# Patient Record
Sex: Male | Born: 1995 | Race: Black or African American | Hispanic: No | Marital: Single | Smoking: Current some day smoker
Health system: Southern US, Community
[De-identification: ages and names within clinical notes are randomized; demographics above are authoritative.]

## PROBLEM LIST (undated history)

## (undated) ENCOUNTER — Ambulatory Visit (HOSPITAL_COMMUNITY): Admission: EM | Payer: Self-pay | Source: Home / Self Care

## (undated) ENCOUNTER — Emergency Department (HOSPITAL_COMMUNITY): Admission: EM | Payer: Self-pay | Source: Home / Self Care

## (undated) DIAGNOSIS — I10 Essential (primary) hypertension: Secondary | ICD-10-CM

## (undated) DIAGNOSIS — K219 Gastro-esophageal reflux disease without esophagitis: Secondary | ICD-10-CM

---
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
## 2015-11-01 ENCOUNTER — Encounter (HOSPITAL_COMMUNITY): Payer: Self-pay | Admitting: Emergency Medicine

## 2015-11-01 ENCOUNTER — Emergency Department (HOSPITAL_COMMUNITY)
Admission: EM | Admit: 2015-11-01 | Discharge: 2015-11-01 | Disposition: A | Discharge status: Home / Self Care | Payer: Self-pay | Attending: Emergency Medicine | Admitting: Emergency Medicine

## 2015-11-01 ENCOUNTER — Emergency Department (HOSPITAL_COMMUNITY): Payer: Self-pay

## 2015-11-01 DIAGNOSIS — J029 Acute pharyngitis, unspecified: Secondary | ICD-10-CM | POA: Insufficient documentation

## 2015-11-01 LAB — RAPID STREP SCREEN (MED CTR MEBANE ONLY): Streptococcus, Group A Screen (Direct): NEGATIVE

## 2015-11-01 MED ORDER — PENICILLIN G BENZATHINE 1200000 UNIT/2ML IM SUSP
1.2000 10*6.[IU] | Freq: Once | INTRAMUSCULAR | Status: AC
  Administered 2015-11-01: 1.2 10*6.[IU] via INTRAMUSCULAR
  Filled 2015-11-01: qty 2

## 2015-11-01 MED ORDER — MAGIC MOUTHWASH
5.0000 mL | Freq: Once | ORAL | Status: AC
  Administered 2015-11-01: 5 mL via ORAL
  Filled 2015-11-01: qty 5

## 2015-11-01 MED ORDER — IBUPROFEN 600 MG PO TABS: 600.0000 mg | ORAL_TABLET | Freq: Four times a day (QID) | ORAL | Status: DC | PRN

## 2015-11-01 MED ORDER — IBUPROFEN 200 MG PO TABS
600.0000 mg | ORAL_TABLET | Freq: Once | ORAL | Status: AC
  Administered 2015-11-01: 600 mg via ORAL
  Filled 2015-11-01: qty 3

## 2015-11-01 MED ORDER — PENICILLIN G BENZATHINE & PROC 1200000 UNIT/2ML IM SUSP
1.2000 10*6.[IU] | Freq: Once | INTRAMUSCULAR | Status: DC
Start: 2015-11-01 — End: 2015-11-01
  Filled 2015-11-01: qty 2

## 2015-11-01 MED ORDER — DEXAMETHASONE SODIUM PHOSPHATE 10 MG/ML IJ SOLN
10.0000 mg | Freq: Once | INTRAMUSCULAR | Status: AC
  Administered 2015-11-01: 10 mg via INTRAMUSCULAR
  Filled 2015-11-01: qty 1

## 2015-11-01 NOTE — Discharge Instructions (Signed)
We saw you in the ER for sore throat. The ER workup currently doesn't show any severe complication from your infection. Take the medicines prescribed.  Return to the ER immediately If your symptoms worsen - that is, you have difficulty breathing, drooling, inability to open mouth, severe headaches, neck pain.   Pharyngitis Pharyngitis is redness, pain, and swelling (inflammation) of your pharynx.  CAUSES  Pharyngitis is usually caused by infection. Most of the time, these infections are from viruses (viral) and are part of a cold. However, sometimes pharyngitis is caused by bacteria (bacterial). Pharyngitis can also be caused by allergies. Viral pharyngitis may be spread from person to person by coughing, sneezing, and personal items or utensils (cups, forks, spoons, toothbrushes). Bacterial pharyngitis may be spread from person to person by more intimate contact, such as kissing.  SIGNS AND SYMPTOMS  Symptoms of pharyngitis include:   Sore throat.   Tiredness (fatigue).   Low-grade fever.   Headache.  Joint pain and muscle aches.  Skin rashes.  Swollen lymph nodes.  Plaque-like film on throat or tonsils (often seen with bacterial pharyngitis). DIAGNOSIS  Your health care provider will ask you questions about your illness and your symptoms. Your medical history, along with a physical exam, is often all that is needed to diagnose pharyngitis. Sometimes, a rapid strep test is done. Other lab tests may also be done, depending on the suspected cause.  TREATMENT  Viral pharyngitis will usually get better in 3-4 days without the use of medicine. Bacterial pharyngitis is treated with medicines that kill germs (antibiotics).  HOME CARE INSTRUCTIONS   Drink enough water and fluids to keep your urine clear or pale yellow.   Only take over-the-counter or prescription medicines as directed by your health care provider:   If you are prescribed antibiotics, make sure you finish them  even if you start to feel better.   Do not take aspirin.   Get lots of rest.   Gargle with 8 oz of salt water ( tsp of salt per 1 qt of water) as often as every 1-2 hours to soothe your throat.   Throat lozenges (if you are not at risk for choking) or sprays may be used to soothe your throat. SEEK MEDICAL CARE IF:   You have large, tender lumps in your neck.  You have a rash.  You cough up green, yellow-brown, or bloody spit. SEEK IMMEDIATE MEDICAL CARE IF:   Your neck becomes stiff.  You drool or are unable to swallow liquids.  You vomit or are unable to keep medicines or liquids down.  You have severe pain that does not go away with the use of recommended medicines.  You have trouble breathing (not caused by a stuffy nose). MAKE SURE YOU:   Understand these instructions.  Will watch your condition.  Will get help right away if you are not doing well or get worse.   This information is not intended to replace advice given to you by your health care provider. Make sure you discuss any questions you have with your health care provider.   Document Released: 04/23/2005 Document Revised: 02/11/2013 Document Reviewed: 12/29/2012 [employer] Interactive Patient Education [employer]2016 [employer]

## 2015-11-01 NOTE — ED Provider Notes (Signed)
CSN: [id number]     Arrival date & time 11/01/15  [id number] History   First MD Initiated Contact with Patient 11/01/15 [phone]     Chief Complaint  Patient presents with  . Sore Throat     (Consider location/radiation/quality/duration/timing/severity/associated sxs/prior Treatment) HPI Comments: Pt comes in with cc of sore throat. Sore throat began on Friday and got worse over the weekend. Pt reports subjective fevers, hoarseness in voice, pain with swallowing and sometimes trouble swallowing. No drooling, no respiratory distress. Pt feels like his mid part of his throat is the worst. No sick contacts. No cough.   Patient is a 20 y.o. male presenting with pharyngitis. The history is provided by the patient.  Sore Throat Pertinent negatives include no shortness of breath.    Past Medical History  Diagnosis Date  . Hypertension   . Acid reflux    History reviewed. No pertinent past surgical history. No family history on file. Social History  Substance Use Topics  . Smoking status: Never Smoker   . Smokeless tobacco: None  . Alcohol Use: No    Review of Systems  Constitutional: Negative for activity change.  HENT: Positive for sore throat. Negative for drooling.   Eyes: Negative for visual disturbance.  Respiratory: Negative for cough and shortness of breath.   Allergic/Immunologic: Negative for immunocompromised state.      Allergies  Review of patient's allergies indicates no known allergies.  Home Medications   Prior to Admission medications   Medication Sig Start Date End Date Taking? Authorizing Provider  ibuprofen (ADVIL,MOTRIN) 600 MG tablet Take 1 tablet (600 mg total) by mouth every 6 (six) hours as needed. 11/01/15   Derwood KaplanAnkit Haset [place], MD  trimethoprim-polymyxin b (POLYTRIM) ophthalmic solution Place 1 drop into the right eye every 4 (four) hours. 10/16/13   Kaitlyn Szekalski, PA-C   BP 139/86 mmHg  Pulse 71  Temp(Src) 98.7 F (37.1 C) (Oral)  Resp 18  SpO2  98% Physical Exam  Constitutional: He is oriented to person, place, and time. He appears well-developed.  HENT:  Head: Atraumatic.  Mouth/Throat: Oropharyngeal exudate present.  No trismus  Neck: Normal range of motion. Neck supple. No tracheal deviation present.  Cardiovascular: Normal rate.   Pulmonary/Chest: Effort normal. No stridor.  Musculoskeletal: He exhibits no edema.  Lymphadenopathy:    He has cervical adenopathy.  Neurological: He is alert and oriented to person, place, and time.  Skin: Skin is warm.  Nursing note and vitals reviewed.   ED Course  Procedures (including critical care time) Labs Review Labs Reviewed  RAPID STREP SCREEN (NOT AT [facility])  CULTURE, GROUP A STREP [facility])    Imaging Review Dg Neck Soft Tissue  11/01/2015  CLINICAL DATA:  Worsening sore throat with neck pain. EXAM: NECK SOFT TISSUES - 1+ VIEW COMPARISON:  None. FINDINGS: Diffuse enlargement of tonsils including adenoid, palatine, and lingual. No airway narrowing/ deviation or prevertebral thickening. Normal epiglottis. No retropharyngeal calcification or osseous finding. IMPRESSION: Diffuse tonsillar enlargement. Electronically Signed   By: Marnee SpringJonathon  Watts M.D.   On: 11/01/2015 10:18   I have personally reviewed and evaluated these images and lab results as part of my medical decision-making.   EKG Interpretation None      MDM   Final diagnoses:  Acute pharyngitis, unspecified pharyngitis type    Pt has sore throat with fair amount of tonsillar enlargement and even exudates. He has no drooling, no resp distress, no stridor. Still Xray soft tissue neck done since he was  c/o lower throat issues  -and the Xrays are reassuring. Will give pen IM and decadron.  Strict ER return precautions have been discussed, and patient is agreeing with the plan and is comfortable with the workup done and the recommendations from the ER.     Derwood KaplanAnkit Grisela Mesch, MD 11/01/15 1052

## 2015-11-01 NOTE — ED Notes (Signed)
Per pt, states sore throat since last week

## 2015-11-01 NOTE — ED Notes (Signed)
Bed: WA05 Expected date:  Expected time:  Means of arrival:  Comments: EVS 

## 2015-11-04 LAB — CULTURE, GROUP A STREP (THRC)

## 2015-11-11 ENCOUNTER — Encounter (HOSPITAL_COMMUNITY): Payer: Self-pay | Admitting: Emergency Medicine

## 2015-11-11 ENCOUNTER — Emergency Department (HOSPITAL_COMMUNITY): Payer: Self-pay

## 2015-11-11 ENCOUNTER — Emergency Department (HOSPITAL_COMMUNITY)
Admission: EM | Admit: 2015-11-11 | Discharge: 2015-11-11 | Disposition: A | Discharge status: Home / Self Care | Payer: Self-pay | Attending: Emergency Medicine | Admitting: Emergency Medicine

## 2015-11-11 DIAGNOSIS — R110 Nausea: Secondary | ICD-10-CM | POA: Insufficient documentation

## 2015-11-11 DIAGNOSIS — I10 Essential (primary) hypertension: Secondary | ICD-10-CM | POA: Insufficient documentation

## 2015-11-11 DIAGNOSIS — Z791 Long term (current) use of non-steroidal anti-inflammatories (NSAID): Secondary | ICD-10-CM | POA: Insufficient documentation

## 2015-11-11 DIAGNOSIS — J029 Acute pharyngitis, unspecified: Secondary | ICD-10-CM | POA: Insufficient documentation

## 2015-11-11 DIAGNOSIS — R590 Localized enlarged lymph nodes: Secondary | ICD-10-CM | POA: Insufficient documentation

## 2015-11-11 LAB — CBC WITH DIFFERENTIAL/PLATELET
Basophils Absolute: 0 10*3/uL (ref 0.0–0.1)
Basophils Relative: 0 %
EOS ABS: 0.2 10*3/uL (ref 0.0–0.7)
EOS PCT: 1 %
HCT: 46.2 % (ref 39.0–52.0)
HEMOGLOBIN: 16.4 g/dL (ref 13.0–17.0)
LYMPHS ABS: 2.6 10*3/uL (ref 0.7–4.0)
Lymphocytes Relative: 14 %
MCH: 28.2 pg (ref 26.0–34.0)
MCHC: 35.5 g/dL (ref 30.0–36.0)
MCV: 79.5 fL (ref 78.0–100.0)
MONO ABS: 0.9 10*3/uL (ref 0.1–1.0)
Monocytes Relative: 5 %
NEUTROS PCT: 80 %
Neutro Abs: 14.6 10*3/uL — ABNORMAL HIGH (ref 1.7–7.7)
PLATELETS: 257 10*3/uL (ref 150–400)
RBC: 5.81 MIL/uL (ref 4.22–5.81)
RDW: 13 % (ref 11.5–15.5)
WBC: 18.3 10*3/uL — AB (ref 4.0–10.5)

## 2015-11-11 LAB — BASIC METABOLIC PANEL
Anion gap: 8 (ref 5–15)
BUN: 12 mg/dL (ref 6–20)
CALCIUM: 9.6 mg/dL (ref 8.9–10.3)
CO2: 24 mmol/L (ref 22–32)
CREATININE: 0.98 mg/dL (ref 0.61–1.24)
Chloride: 104 mmol/L (ref 101–111)
Glucose, Bld: 105 mg/dL — ABNORMAL HIGH (ref 65–99)
Potassium: 4 mmol/L (ref 3.5–5.1)
SODIUM: 136 mmol/L (ref 135–145)

## 2015-11-11 LAB — RAPID STREP SCREEN (MED CTR MEBANE ONLY): STREPTOCOCCUS, GROUP A SCREEN (DIRECT): NEGATIVE

## 2015-11-11 LAB — MONONUCLEOSIS SCREEN: MONO SCREEN: NEGATIVE

## 2015-11-11 MED ORDER — IOPAMIDOL (ISOVUE-300) INJECTION 61%
75.0000 mL | Freq: Once | INTRAVENOUS | Status: AC | PRN
  Administered 2015-11-11: 75 mL via INTRAVENOUS

## 2015-11-11 MED ORDER — DEXAMETHASONE SODIUM PHOSPHATE 10 MG/ML IJ SOLN
10.0000 mg | Freq: Once | INTRAMUSCULAR | Status: AC
  Administered 2015-11-11: 10 mg via INTRAVENOUS
  Filled 2015-11-11: qty 1

## 2015-11-11 MED ORDER — CLINDAMYCIN PHOSPHATE 600 MG/50ML IV SOLN
600.0000 mg | Freq: Once | INTRAVENOUS | Status: AC
  Administered 2015-11-11: 600 mg via INTRAVENOUS
  Filled 2015-11-11: qty 50

## 2015-11-11 MED ORDER — SODIUM CHLORIDE 0.9 % IV BOLUS (SEPSIS)
1000.0000 mL | Freq: Once | INTRAVENOUS | Status: AC
  Administered 2015-11-11: 1000 mL via INTRAVENOUS

## 2015-11-11 MED ORDER — NAPROXEN 500 MG PO TABS: 500.0000 mg | ORAL_TABLET | Freq: Two times a day (BID) | ORAL | Status: DC | PRN

## 2015-11-11 MED ORDER — CLINDAMYCIN HCL 150 MG PO CAPS: 300.0000 mg | ORAL_CAPSULE | Freq: Four times a day (QID) | ORAL | Status: DC

## 2015-11-11 NOTE — ED Notes (Signed)
Pt complaint of continued sore throat post treatment 6/27.

## 2015-11-11 NOTE — ED Notes (Signed)
Pt sipping water with no difficulty

## 2015-11-11 NOTE — Discharge Instructions (Signed)
Please take all of your antibiotics until finished! Continue to stay well-hydrated. Gargle warm salt water and spit it out to ease sore throat. Naproxen as needed for pain. You can also alternate this with tylenol as needed for pain. Follow up with your primary care doctor in 5-7 days for recheck of ongoing symptoms and return to emergency department for difficulty breathing, difficulty swallowing, any new or worsening of symptoms.

## 2015-11-11 NOTE — ED Notes (Signed)
PA at bedside.

## 2015-11-11 NOTE — ED Provider Notes (Signed)
CSN: [id number]     Arrival date & time 11/11/15  0919 History   First MD Initiated Contact with Patient 11/11/15 [phone]     Chief Complaint  Patient presents with  . Sore Throat    (Consider location/radiation/quality/duration/timing/severity/associated sxs/prior Treatment) Patient is a 20 y.o. male presenting with pharyngitis. The history is provided by the patient and medical records. No language interpreter was used.  Sore Throat Associated symptoms include nausea and a sore throat. Pertinent negatives include no abdominal pain, chills, congestion, coughing, fever, headaches, myalgias, rash or vomiting.     Harry Donovan is a 20 y.o. male  with a PMH of HTN, GERD who presents to the Emergency Department complaining of worsening sore throat over the last two weeks. Seen in ED on 6/27 where rapid strep and throat cx were negative. DG neck at that time showed diffuse tonsillar enlargement. He was treated with pen IM and decadron at that time. Patient states sore throat has continued to worsen and he is having more difficulty swallowing than in the past, often having to spit out excess secretions. Denies difficulty breathing currently, but states at night when he is laying down sleeping, he will often awaken gasping for air. Admits to mild associated nausea and decreased PO. Denies fever, abdominal pain, chest pain.   Past Medical History  Diagnosis Date  . Hypertension   . Acid reflux    History reviewed. No pertinent past surgical history. No family history on file. Social History  Substance Use Topics  . Smoking status: Never Smoker   . Smokeless tobacco: None  . Alcohol Use: No    Review of Systems  Constitutional: Positive for appetite change. Negative for fever and chills.  HENT: Positive for sore throat and trouble swallowing. Negative for congestion.   Eyes: Negative for visual disturbance.  Respiratory: Negative for cough.   Cardiovascular: Negative.   Gastrointestinal:  Positive for nausea. Negative for vomiting and abdominal pain.  Musculoskeletal: Negative for myalgias and neck stiffness.  Skin: Negative for rash.  Allergic/Immunologic: Negative for immunocompromised state.  Neurological: Negative for headaches.      Allergies  Review of patient's allergies indicates no known allergies.  Home Medications   Prior to Admission medications   Medication Sig Start Date End Date Taking? Authorizing Provider  ibuprofen (ADVIL,MOTRIN) 600 MG tablet Take 1 tablet (600 mg total) by mouth every 6 (six) hours as needed. Patient taking differently: Take 600 mg by mouth every 6 (six) hours as needed for moderate pain.  11/01/15  Yes Derwood Kaplan, MD  clindamycin (CLEOCIN) 150 MG capsule Take 2 capsules (300 mg total) by mouth 4 (four) times daily. 11/11/15   Chase Picket Zilda No, PA-C  naproxen (NAPROSYN) 500 MG tablet Take 1 tablet (500 mg total) by mouth 2 (two) times daily as needed for mild pain or moderate pain. 11/11/15   Neveyah Garzon Pilcher Basir Niven, PA-C   BP 140/74 mmHg  Pulse 70  Temp(Src) 98.6 F (37 C) (Oral)  Resp 16  Ht  (1.803 m)  Wt 131.543 kg  BMI 40.46 kg/m2  SpO2 98% Physical Exam  Constitutional: He is oriented to person, place, and time. He appears well-developed and well-nourished. No distress.  Hoarse sounding voice, NAD.   HENT:  Head: Normocephalic and atraumatic.  OP with exudates and significant tonsillar hypertrophy. Uvula midline. No focal sinus tenderness.   Neck: Normal range of motion. Neck supple. No tracheal deviation present.  No meningeal signs. Mild TTP of right anterior  neck.  Cardiovascular: Normal rate, regular rhythm and normal heart sounds.   Pulmonary/Chest: Effort normal. No stridor.  Lungs are clear to auscultation bilaterally - no w/r/r. 98-100% O2 on RA.   Abdominal: Soft. He exhibits no distension. There is no tenderness.  Musculoskeletal: Normal range of motion.  Lymphadenopathy:    He has cervical adenopathy.   Neurological: He is alert and oriented to person, place, and time.  Skin: Skin is warm and dry. He is not diaphoretic.  Nursing note and vitals reviewed.   ED Course  Procedures (including critical care time) Labs Review Labs Reviewed  BASIC METABOLIC PANEL - Abnormal; Notable for the following:    Glucose, Bld 105 (*)    All other components within normal limits  CBC WITH DIFFERENTIAL/PLATELET - Abnormal; Notable for the following:    WBC 18.3 (*)    Neutro Abs 14.6 (*)    All other components within normal limits  RAPID STREP SCREEN (NOT AT [facility])  CULTURE, GROUP A STREP [facility])  MONONUCLEOSIS SCREEN    Imaging Review Ct Soft Tissue Neck W Contrast  11/11/2015  CLINICAL DATA:  20 year old with sore throat for 4 weeks. Worsening despite antibiotic and steroid therapy. EXAM: CT NECK WITH CONTRAST TECHNIQUE: Multidetector CT imaging of the neck was performed using the standard protocol following the bolus administration of intravenous contrast. CONTRAST:  75mL ISOVUE-300 IOPAMIDOL (ISOVUE-300) INJECTION 61% COMPARISON:  Soft tissue neck radiographs 11/01/2015 FINDINGS: Pharynx and larynx: There is diffuse enlargement of the palatine and adenoid tonsils with associated fullness of the nasopharynx. There is no thickening of the epiglottis. The larynx is normal. The parapharyngeal spaces are normal. Salivary glands: Parotid glands and submandibular glands are normal. No sialolithiasis. Thyroid: Normal Lymph nodes: There are numerous bilateral enlarged cervical lymph nodes, with the largest located at left Level 3 (series 3, image 58) measuring 1.6 x 1.4 cm. Multiple enlarged level IIa nodes, measuring up to 1.6 cm. Vascular: Normal Limited intracranial: Normal Visualized orbits: Unremarkable Mastoids and visualized paranasal sinuses: Normal Skeleton: Normal Upper chest: Normal IMPRESSION: 1. Diffuse enlargement of the palatine and adenoid tonsils, consistent with tonsillitis. No tonsillar abscess  or fluid collection. 2. Bilateral, left-greater-than-right, reactive cervical lymphadenopathy. Electronically Signed   By: Deatra RobinsonKevin  Herman M.D.   On: 11/11/2015 11:20   I have personally reviewed and evaluated these images and lab results as part of my medical decision-making.   EKG Interpretation None      MDM   Final diagnoses:  Pharyngitis   Harry Donovan presents to ED for worsening sore throat x 2 weeks. Seen in ED for same on 6/27 - chart reviewed from that visit. Cx from that visit negative for strep. Pen IM given that day as well. On exam, patient is 98-100% O2 on RA with no increased effort in breathing. OP with significant tonsillar hypertrophy. TTP of right anterior neck. Appears to be having some difficulty swallowing secretions, stating mainly 2/2 pain. Will obtain CT soft tissue neck to further assess, as well as checking mono, cbc, and bmp. Poor PO intake -- will give IV fluids. Clinda IV given as well.   CBC with white count of 18.3 and shift of 14.6. Strep and mono negative, BMP reassuring. CT neck shows no abscess or fluid collection. Diffuse enlargement consistent with tonsillitis and reactive cervical lymphadenopathy seen. Patient was reevaluated and does feel much improved after Decadron. Tolerating PO and drinking water without difficulty. No airway compromise. Feel the patient is safe for discharge to  home, however strict return precautions were given and PCP follow-up recommended in 5-7 days for recheck of symptoms. Rx for Clinda and naproxen. Symptomatic home care instructions were discussed. Patient and mother at bedside agree with treatment plan and understand reasons to return to the ED immediately.  Patient discussed with Dr. Bebe ShaggyWickline who agrees with treatment plan.     [facility] ServicesJaime Pilcher Arly Salminen, PA-C 11/11/15 1242  Zadie Rhineonald Wickline, MD 11/12/15 [phone]

## 2015-11-13 LAB — CULTURE, GROUP A STREP (THRC)

## 2017-03-16 ENCOUNTER — Ambulatory Visit: Payer: Self-pay | Admitting: Family Medicine

## 2017-06-26 ENCOUNTER — Encounter (HOSPITAL_COMMUNITY): Payer: Self-pay

## 2017-06-26 ENCOUNTER — Emergency Department (HOSPITAL_COMMUNITY)
Admission: EM | Admit: 2017-06-26 | Discharge: 2017-06-26 | Disposition: A | Discharge status: Home / Self Care | Payer: Self-pay | Attending: Emergency Medicine | Admitting: Emergency Medicine

## 2017-06-26 DIAGNOSIS — Y929 Unspecified place or not applicable: Secondary | ICD-10-CM | POA: Insufficient documentation

## 2017-06-26 DIAGNOSIS — Z79899 Other long term (current) drug therapy: Secondary | ICD-10-CM | POA: Insufficient documentation

## 2017-06-26 DIAGNOSIS — Z189 Retained foreign body fragments, unspecified material: Secondary | ICD-10-CM | POA: Insufficient documentation

## 2017-06-26 DIAGNOSIS — Y999 Unspecified external cause status: Secondary | ICD-10-CM | POA: Insufficient documentation

## 2017-06-26 DIAGNOSIS — S00452A Superficial foreign body of left ear, initial encounter: Secondary | ICD-10-CM | POA: Insufficient documentation

## 2017-06-26 DIAGNOSIS — X58XXXA Exposure to other specified factors, initial encounter: Secondary | ICD-10-CM | POA: Insufficient documentation

## 2017-06-26 DIAGNOSIS — H60393 Other infective otitis externa, bilateral: Secondary | ICD-10-CM

## 2017-06-26 DIAGNOSIS — Y939 Activity, unspecified: Secondary | ICD-10-CM | POA: Insufficient documentation

## 2017-06-26 DIAGNOSIS — I10 Essential (primary) hypertension: Secondary | ICD-10-CM | POA: Insufficient documentation

## 2017-06-26 DIAGNOSIS — S00451A Superficial foreign body of right ear, initial encounter: Secondary | ICD-10-CM | POA: Insufficient documentation

## 2017-06-26 MED ORDER — SULFAMETHOXAZOLE-TRIMETHOPRIM 800-160 MG PO TABS: 1.0000 | ORAL_TABLET | Freq: Two times a day (BID) | ORAL | 0 refills | Status: DC

## 2017-06-26 NOTE — ED Provider Notes (Signed)
[facility] Provider Note   CSN: [id number] Arrival date & time: 06/26/17  1137     History   Chief Complaint No chief complaint on file.   HPI Harry Donovan is a 22 y.o. male with a PMHx of HTN, GERD, and other conditions listed below, who presents to the ED with complaints of both earrings being stuck in his earlobes since last night.  Patient reports that he put his earrings and they got stuck, with the post that goes through his earlobe becoming embedded within the earlobe itself.  He reports 4/10 intermittent throbbing nonradiating left earlobe pain which worsens with touching the area and with no treatments tried prior to arrival.  He also mentions that he has had some drainage from both earlobes for a few days, although slightly worse on the left.  He denies any warmth to the earlobes.  He denies any ear pain aside from the earlobes, or ear drainage aside from the earlobes.  He denies any hearing loss, fevers, or any other complaints at this time.  He denies history of any immunocompromising conditions such as diabetes or HIV.   The history is provided by the patient and medical records. No language interpreter was used.  Foreign Body in Ear  This is a new problem. The current episode started yesterday. The problem occurs constantly. The problem has not changed since onset.Nothing aggravates the symptoms. Nothing relieves the symptoms. He has tried nothing for the symptoms. The treatment provided no relief.    Past Medical History:  Diagnosis Date  . Acid reflux   . Hypertension     Patient Active Problem List   Diagnosis Date Noted  . Hyperglycemia 01/10/2011  . Chest pain 12/06/2010  . Essential hypertension, benign 12/06/2010  . Encounter for sports participation examination 12/06/2010    History reviewed. No pertinent surgical history.     Home Medications    Prior to Admission medications   Medication Sig Start Date End  Date Taking? Authorizing Provider  clindamycin (CLEOCIN) 150 MG capsule Take 2 capsules (300 mg total) by mouth 4 (four) times daily. 11/11/15   Ward, Chase PicketJaime Pilcher, PA-C  ibuprofen (ADVIL,MOTRIN) 600 MG tablet Take 1 tablet (600 mg total) by mouth every 6 (six) hours as needed. Patient taking differently: Take 600 mg by mouth every 6 (six) hours as needed for moderate pain.  11/01/15   Derwood KaplanNanavati, Ankit, MD  naproxen (NAPROSYN) 500 MG tablet Take 1 tablet (500 mg total) by mouth 2 (two) times daily as needed for mild pain or moderate pain. 11/11/15   Ward, Chase PicketJaime Pilcher, PA-C    Family History No family history on file.  Social History Social History   Tobacco Use  . Smoking status: Never Smoker  Substance Use Topics  . Alcohol use: No  . Drug use: Not on file     Allergies   Patient has no known allergies.   Review of Systems Review of Systems  Constitutional: Negative for chills and fever.  HENT: Positive for ear discharge (earlobe, but not within ear) and ear pain (left earlobe, not within ear). Negative for hearing loss.   Skin: Positive for wound (earlobes).  Allergic/Immunologic: Negative for immunocompromised state.     Physical Exam Updated Vital Signs BP (!) 158/77 (BP Location: Right Arm)   Pulse (!) 50   Temp 98.5 F (36.9 C) (Oral)   Resp 16   SpO2 99%   Physical Exam  Constitutional: He is oriented  to person, place, and time. Vital signs are normal. He appears well-developed and well-nourished.  Non-toxic appearance. No distress.  Afebrile, nontoxic, NAD  HENT:  Head: Normocephalic and atraumatic.  Right Ear: There is swelling (earlobe) and tenderness (earlobe). No drainage. A foreign body (earring embedded in earlobe ) is present.  Left Ear: There is drainage (earlobe), swelling (earlobe) and tenderness (earlobe). A foreign body (earring embedded in earlobe) is present.  Mouth/Throat: Mucous membranes are normal.  Hoop earring posts embedded into earlobe on  both sides of the lobe, easily removed with forceps; mild swelling and scantly purulent drainage from L earlobe, no erythema or warmth; minimal swelling and slight crusting of serous fluid on R earlobe, also without erythema or warmth. Left earlobe mildly tender and indurated, no fluctuance. R earlobe without tenderness.  Eyes: Conjunctivae and EOM are normal. Right eye exhibits no discharge. Left eye exhibits no discharge.  Neck: Normal range of motion. Neck supple.  Cardiovascular: Normal rate and intact distal pulses.  Pulmonary/Chest: Effort normal. No respiratory distress.  Abdominal: Normal appearance. He exhibits no distension.  Musculoskeletal: Normal range of motion.  Neurological: He is alert and oriented to person, place, and time. He has normal strength. No sensory deficit.  Skin: Skin is warm, dry and intact. No rash noted.  See HENT exam  Psychiatric: He has a normal mood and affect.  Nursing note and vitals reviewed.    ED Treatments / Results  Labs (all labs ordered are listed, but only abnormal results are displayed) Labs Reviewed - No data to display  EKG  EKG Interpretation None       Radiology No results found.  Procedures .Foreign Body Removal Date/Time: 06/26/2017 12:32 PM Performed by: Rhona Raider, PA-C Authorized by: Rhona Raider, [place]  Consent: Verbal consent obtained. Risks and benefits: risks, benefits and alternatives were discussed Consent given by: patient Patient understanding: patient states understanding of the procedure being performed Patient consent: the patient's understanding of the procedure matches consent given Patient identity confirmed: verbally with patient Body area: skin General location: head/neck Location details: right external ear  Sedation: Patient sedated: no  Patient restrained: no Patient cooperative: yes Localization method: visualized Removal mechanism: hemostat Tendon involvement: none Depth:  subcutaneous Complexity: simple 1 objects recovered. Objects recovered: earring Post-procedure assessment: foreign body removed Patient tolerance: Patient tolerated the procedure well with no immediate complications Comments: Earring embedded into ear lobe, removed using hemostats .Foreign Body Removal Date/Time: 06/26/2017 12:43 PM Performed by: Rhona Raider, PA-C Authorized by: Rhona Raider, [place]  Consent: Verbal consent obtained. Risks and benefits: risks, benefits and alternatives were discussed Consent given by: patient Patient understanding: patient states understanding of the procedure being performed Patient consent: the patient's understanding of the procedure matches consent given Patient identity confirmed: verbally with patient Body area: skin General location: head/neck Location details: left external ear  Sedation: Patient sedated: no  Patient restrained: no Patient cooperative: yes Localization method: visualized Removal mechanism: hemostat Tendon involvement: none Depth: subcutaneous Complexity: simple 1 objects recovered. Objects recovered: earring Post-procedure assessment: foreign body removed Patient tolerance: Patient tolerated the procedure well with no immediate complications Comments: Earring embedded into ear lobe, removed using hemostats   (including critical care time)  Medications Ordered in ED Medications - No data to display   Initial Impression / Assessment and Plan / ED Course  I have reviewed the triage vital signs and the nursing notes.  Pertinent labs & imaging results that were available during my care of the patient  were reviewed by me and considered in my medical decision making (see chart for details).     22 y.o. male here with embedded earrings in both earlobes. Has had some pain mostly to L ear, and reports having some drainage from both earlobes for a few days, L>R. On exam, earring posts embedded into earlobe on both  sides of the lobe, easily removed with forceps; mild swelling and scantly purulent drainage from L earlobe, no erythema or warmth; minimal swelling and slight crusting of serous fluid on R earlobe, also without erythema or warmth. Left earlobe mildly tender and indurated, no fluctuance. Could be early superficial infection, does not appear to be deep or very involved, not quite cellulitic. Will attempt topical wound care and if improving then no need for abx; if unimproved in 2-3 days, or if symptoms start worsening, pt given safety script for PO abx to start in that case. Strict guidelines of this discussed. Advised not using earrings until areas improve. F/up with PCP in 5-7 days for recheck. I explained the diagnosis and have given explicit precautions to return to the ER including for any other new or worsening symptoms. The patient understands and accepts the medical plan as it's been dictated and I have answered their questions. Discharge instructions concerning home care and prescriptions have been given. The patient is STABLE and is discharged to home in good condition.    Final Clinical Impressions(s) / ED Diagnoses   Final diagnoses:  Embedded earring of left ear, initial encounter  Embedded earring of right ear, initial encounter  Infection of both earlobes    ED Discharge Orders        Ordered    sulfamethoxazole-trimethoprim (BACTRIM DS,SEPTRA DS) 800-160 MG tablet  2 times daily     06/26/17 [address], [place], [place] 06/26/17 1253    Cathren Laine, MD 06/26/17 2146

## 2017-06-26 NOTE — Discharge Instructions (Signed)
Keep wound clean and dry, using hydrogen peroxide and a topical antibiotic ointment AT LEAST 4 TIMES PER DAY. Apply warm compresses to affected area throughout the day. If the infections worsen, or fail to improve in the next 2-3 days, then start taking the antibiotic as directed until it is finished. IF THE INFECTIONS IMPROVE WITH JUST THE TOPICAL ANTIBIOTIC OINTMENT, THEN YOU DO NOT NEED TO START THE ORAL ANTIBIOTIC. Alternate between tylenol and motrin as needed for pain. DO NOT PUT ANY EARRINGS IN UNTIL YOUR EARLOBE INFECTIONS IMPROVE. [facility]-7 days for wound recheck and recheck of symptoms. Monitor area for signs of infection to include, but not limited to: increasing pain, spreading redness, drainage/pus, worsening swelling, or fevers. Return to emergency department for emergent changing or worsening symptoms.

## 2017-06-26 NOTE — ED Triage Notes (Signed)
Patient complains of bilateral ear soreness due to earrings being stuck in ears since last night. NAD

## 2017-07-18 ENCOUNTER — Emergency Department (HOSPITAL_COMMUNITY)
Admission: EM | Admit: 2017-07-18 | Discharge: 2017-07-18 | Disposition: A | Discharge status: Home / Self Care | Payer: Self-pay | Attending: Emergency Medicine | Admitting: Emergency Medicine

## 2017-07-18 ENCOUNTER — Emergency Department (HOSPITAL_COMMUNITY): Payer: Self-pay

## 2017-07-18 ENCOUNTER — Encounter (HOSPITAL_COMMUNITY): Payer: Self-pay | Admitting: Emergency Medicine

## 2017-07-18 DIAGNOSIS — Z5321 Procedure and treatment not carried out due to patient leaving prior to being seen by health care provider: Secondary | ICD-10-CM | POA: Insufficient documentation

## 2017-07-18 DIAGNOSIS — R0602 Shortness of breath: Secondary | ICD-10-CM | POA: Insufficient documentation

## 2017-07-18 DIAGNOSIS — R079 Chest pain, unspecified: Secondary | ICD-10-CM | POA: Insufficient documentation

## 2017-07-18 LAB — I-STAT TROPONIN, ED: Troponin i, poc: 0 ng/mL (ref 0.00–0.08)

## 2017-07-18 NOTE — ED Notes (Signed)
RN called patient, patient did not answer.

## 2017-07-18 NOTE — ED Notes (Signed)
Called patient - did not answer

## 2017-07-18 NOTE — ED Triage Notes (Signed)
Pt reports SOB, central CP onset last night. Pt also reports he has been belching all evening. Pt states hx of GERD but does not take his medicine, also reports he had [employer] for dinner last night

## 2017-09-14 ENCOUNTER — Ambulatory Visit: Payer: Self-pay | Admitting: Urgent Care

## 2017-09-16 ENCOUNTER — Ambulatory Visit: Payer: Self-pay | Admitting: Physician Assistant

## 2017-09-29 ENCOUNTER — Emergency Department (HOSPITAL_COMMUNITY): Payer: Self-pay

## 2017-09-29 ENCOUNTER — Emergency Department (HOSPITAL_COMMUNITY)
Admission: EM | Admit: 2017-09-29 | Discharge: 2017-09-29 | Disposition: A | Discharge status: Home / Self Care | Payer: Self-pay | Attending: Emergency Medicine | Admitting: Emergency Medicine

## 2017-09-29 ENCOUNTER — Other Ambulatory Visit: Payer: Self-pay

## 2017-09-29 ENCOUNTER — Encounter (HOSPITAL_COMMUNITY): Payer: Self-pay | Admitting: Emergency Medicine

## 2017-09-29 DIAGNOSIS — Z5321 Procedure and treatment not carried out due to patient leaving prior to being seen by health care provider: Secondary | ICD-10-CM | POA: Insufficient documentation

## 2017-09-29 DIAGNOSIS — R079 Chest pain, unspecified: Secondary | ICD-10-CM | POA: Insufficient documentation

## 2017-09-29 LAB — BASIC METABOLIC PANEL
Anion gap: 10 (ref 5–15)
BUN: 25 mg/dL — AB (ref 6–20)
CHLORIDE: 106 mmol/L (ref 101–111)
CO2: 22 mmol/L (ref 22–32)
Calcium: 9 mg/dL (ref 8.9–10.3)
Creatinine, Ser: 1.14 mg/dL (ref 0.61–1.24)
GFR calc Af Amer: >60 mL/min (ref >60)
GLUCOSE: 100 mg/dL — AB (ref 65–99)
POTASSIUM: 3.6 mmol/L (ref 3.5–5.1)
Sodium: 138 mmol/L (ref 135–145)

## 2017-09-29 LAB — CBC
HEMATOCRIT: 40.6 % (ref 39.0–52.0)
HEMOGLOBIN: 13.3 g/dL (ref 13.0–17.0)
MCH: 27.4 pg (ref 26.0–34.0)
MCHC: 32.8 g/dL (ref 30.0–36.0)
MCV: 83.7 fL (ref 78.0–100.0)
Platelets: 190 10*3/uL (ref 150–400)
RBC: 4.85 MIL/uL (ref 4.22–5.81)
RDW: 13.4 % (ref 11.5–15.5)
WBC: 11.1 10*3/uL — ABNORMAL HIGH (ref 4.0–10.5)

## 2017-09-29 LAB — I-STAT TROPONIN, ED: Troponin i, poc: 0.01 ng/mL (ref 0.00–0.08)

## 2017-09-29 NOTE — ED Triage Notes (Signed)
Pt reports having burning in epigastric area and was dx with acid reflux disease as a teen and is not currently taking any medications.

## 2017-09-29 NOTE — ED Notes (Signed)
Patient eloped from room without being seen.

## 2017-10-28 ENCOUNTER — Ambulatory Visit: Payer: Self-pay | Admitting: Physician Assistant

## 2019-02-18 ENCOUNTER — Encounter (HOSPITAL_COMMUNITY): Payer: Self-pay

## 2019-02-18 ENCOUNTER — Other Ambulatory Visit: Payer: Self-pay

## 2019-02-18 ENCOUNTER — Emergency Department (HOSPITAL_COMMUNITY): Payer: Self-pay

## 2019-02-18 ENCOUNTER — Emergency Department (HOSPITAL_COMMUNITY)
Admission: EM | Admit: 2019-02-18 | Discharge: 2019-02-19 | Discharge status: Against Medical Advice | Payer: Self-pay | Attending: Emergency Medicine | Admitting: Emergency Medicine

## 2019-02-18 DIAGNOSIS — Z5321 Procedure and treatment not carried out due to patient leaving prior to being seen by health care provider: Secondary | ICD-10-CM | POA: Insufficient documentation

## 2019-02-18 LAB — CBC
HCT: 46.7 % (ref 39.0–52.0)
Hemoglobin: 15.3 g/dL (ref 13.0–17.0)
MCH: 28 pg (ref 26.0–34.0)
MCHC: 32.8 g/dL (ref 30.0–36.0)
MCV: 85.4 fL (ref 80.0–100.0)
Platelets: 183 10*3/uL (ref 150–400)
RBC: 5.47 MIL/uL (ref 4.22–5.81)
RDW: 13.3 % (ref 11.5–15.5)
WBC: 9.8 10*3/uL (ref 4.0–10.5)
nRBC: 0 % (ref 0.0–0.2)

## 2019-02-18 LAB — BASIC METABOLIC PANEL
Anion gap: 10 (ref 5–15)
BUN: 12 mg/dL (ref 6–20)
CO2: 21 mmol/L — ABNORMAL LOW (ref 22–32)
Calcium: 9.6 mg/dL (ref 8.9–10.3)
Chloride: 109 mmol/L (ref 98–111)
Creatinine, Ser: 1.13 mg/dL (ref 0.61–1.24)
GFR calc Af Amer: >60 mL/min (ref >60)
GFR calc non Af Amer: >60 mL/min (ref >60)
Glucose, Bld: 98 mg/dL (ref 70–99)
Potassium: 4.4 mmol/L (ref 3.5–5.1)
Sodium: 140 mmol/L (ref 135–145)

## 2019-02-18 LAB — TROPONIN I (HIGH SENSITIVITY): Troponin I (High Sensitivity): 5 ng/L (ref <18)

## 2019-02-18 MED ORDER — SODIUM CHLORIDE 0.9% FLUSH: 3.0000 mL | Freq: Once | INTRAVENOUS | Status: DC

## 2019-02-18 NOTE — ED Triage Notes (Signed)
Pt states he was playing football when he felt dizzy and lightheaded. Pt states he also thinks his acid reflux is flaring up. Pt c.o slight chest pain. Pt a.o, nad noted

## 2019-02-19 NOTE — ED Notes (Signed)
Pt called x3. No reply. 

## 2019-04-16 ENCOUNTER — Other Ambulatory Visit: Payer: Self-pay

## 2019-04-16 ENCOUNTER — Emergency Department (HOSPITAL_COMMUNITY)
Admission: EM | Admit: 2019-04-16 | Discharge: 2019-04-16 | Disposition: A | Discharge status: Home / Self Care | Payer: Self-pay | Attending: Emergency Medicine | Admitting: Emergency Medicine

## 2019-04-16 ENCOUNTER — Encounter (HOSPITAL_COMMUNITY): Payer: Self-pay | Admitting: Emergency Medicine

## 2019-04-16 DIAGNOSIS — S39012A Strain of muscle, fascia and tendon of lower back, initial encounter: Secondary | ICD-10-CM | POA: Insufficient documentation

## 2019-04-16 DIAGNOSIS — Y990 Civilian activity done for income or pay: Secondary | ICD-10-CM | POA: Insufficient documentation

## 2019-04-16 DIAGNOSIS — I10 Essential (primary) hypertension: Secondary | ICD-10-CM | POA: Insufficient documentation

## 2019-04-16 DIAGNOSIS — Y9289 Other specified places as the place of occurrence of the external cause: Secondary | ICD-10-CM | POA: Insufficient documentation

## 2019-04-16 DIAGNOSIS — F17290 Nicotine dependence, other tobacco product, uncomplicated: Secondary | ICD-10-CM | POA: Insufficient documentation

## 2019-04-16 DIAGNOSIS — W19XXXA Unspecified fall, initial encounter: Secondary | ICD-10-CM

## 2019-04-16 DIAGNOSIS — Z79899 Other long term (current) drug therapy: Secondary | ICD-10-CM | POA: Insufficient documentation

## 2019-04-16 DIAGNOSIS — W010XXA Fall on same level from slipping, tripping and stumbling without subsequent striking against object, initial encounter: Secondary | ICD-10-CM | POA: Insufficient documentation

## 2019-04-16 DIAGNOSIS — Y939 Activity, unspecified: Secondary | ICD-10-CM | POA: Insufficient documentation

## 2019-04-16 MED ORDER — METHOCARBAMOL 500 MG PO TABS: 500.0000 mg | ORAL_TABLET | Freq: Two times a day (BID) | ORAL | 0 refills | Status: DC

## 2019-04-16 MED ORDER — IBUPROFEN 200 MG PO TABS
600.0000 mg | ORAL_TABLET | Freq: Once | ORAL | Status: AC
  Administered 2019-04-16: 600 mg via ORAL
  Filled 2019-04-16: qty 3

## 2019-04-16 NOTE — ED Provider Notes (Signed)
[facility]-EMERGENCY DEPT Provider Note   CSN: [id number] Arrival date & time: 04/16/19  1607     History Chief Complaint  Patient presents with  . Back Pain    Harry Donovan is a 23 y.o. male with a history of HTN and acid reflux who presents to the emergency department with a chief complaint of fall.  The patient reports that he was at work approximately a day and a half ago when he slipped on degreaser on the floor and fell forward onto his outstretched hands and abdomen.  Reports that he was able to get up and ambulate after the fall.  He denies hitting his head, syncope, nausea, or vomiting.  He reports that he developed a rash on the right forearm, likely from the degreaser, that is since resolved.  His primary reason for presenting to the ER is throbbing, nonradiating right sided low back pain that began gradually after the fall and has remained constant.  He reports that he has been able to ambulate without difficulty.  He denies fever, chills, fecal or urinary incontinence, hematuria, abdominal pain, chest pain, shortness of breath, arthralgias, pain in the bilateral upper or lower extremities, weakness, or numbness.  Reports that his mother placed muscle cream over the area with minimal improvement.  No other treatment prior to arrival.  No history of IV drug use.  The history is provided by the patient. No language interpreter was used.       Past Medical History:  Diagnosis Date  . Acid reflux   . Hypertension     Patient Active Problem List   Diagnosis Date Noted  . Hyperglycemia 01/10/2011  . Chest pain 12/06/2010  . Essential hypertension, benign 12/06/2010  . Encounter for sports participation examination 12/06/2010    History reviewed. No pertinent surgical history.     History reviewed. No pertinent family history.  Social History   Tobacco Use  . Smoking status: Current Some Day Smoker    Types: Cigars  . Smokeless tobacco:  Never Used  Substance Use Topics  . Alcohol use: No  . Drug use: No    Home Medications Prior to Admission medications   Medication Sig Start Date End Date Taking? Authorizing Provider  clindamycin (CLEOCIN) 150 MG capsule Take 2 capsules (300 mg total) by mouth 4 (four) times daily. 11/11/15   Ward, Chase Picket, PA-C  ibuprofen (ADVIL,MOTRIN) 600 MG tablet Take 1 tablet (600 mg total) by mouth every 6 (six) hours as needed. Patient taking differently: Take 600 mg by mouth every 6 (six) hours as needed for moderate pain.  11/01/15   Derwood Kaplan, MD  methocarbamol (ROBAXIN) 500 MG tablet Take 1 tablet (500 mg total) by mouth 2 (two) times daily. 04/16/19   Libia Fazzini A, PA-C  naproxen (NAPROSYN) 500 MG tablet Take 1 tablet (500 mg total) by mouth 2 (two) times daily as needed for mild pain or moderate pain. 11/11/15   Ward, Chase Picket, PA-C  sulfamethoxazole-trimethoprim (BACTRIM DS,SEPTRA DS) 800-160 MG tablet Take 1 tablet by mouth 2 (two) times daily. ONLY START TAKING IF YOUR SYMPTOMS WORSEN, OR IF YOU DO NOT IMPROVE IN 2-3 DAYS 06/26/17   Street, [place], [place]    Allergies    Patient has no known allergies.  Review of Systems   Review of Systems  Constitutional: Negative for fatigue and fever.  HENT: Negative for congestion and sore throat.   Respiratory: Negative for chest tightness and shortness of breath.  Cardiovascular: Negative for chest pain.  Gastrointestinal: Negative for abdominal pain, diarrhea, nausea and vomiting.  Genitourinary: Negative for dysuria, frequency, hematuria and urgency.  Musculoskeletal: Positive for arthralgias, back pain and myalgias. Negative for gait problem ( 2/2 pain), joint swelling, neck pain and neck stiffness.  Skin: Negative for rash.  Neurological: Negative for syncope, weakness, light-headedness, numbness and headaches.  All other systems reviewed and are negative.   Physical Exam Updated Vital Signs BP (!) 115/110 (BP Location:  Right Arm)   Pulse 73   Temp 98.1 F (36.7 C) (Oral)   Resp 16   Ht 5\' 11"  (1.803 m)   Wt 94.8 kg   SpO2 98%   BMI 29.15 kg/m   Physical Exam Vitals and nursing note reviewed.  Constitutional:      Appearance: He is well-developed.  HENT:     Head: Normocephalic.  Eyes:     Conjunctiva/sclera: Conjunctivae normal.  Cardiovascular:     Rate and Rhythm: Normal rate and regular rhythm.     Heart sounds: Normal heart sounds. No murmur. No friction rub. No gallop.   Pulmonary:     Effort: Pulmonary effort is normal.     Comments: No tenderness to the chest wall.  Clear and equal breath sounds in all lung fields. Chest:     Chest wall: No tenderness.  Abdominal:     General: There is no distension.     Palpations: Abdomen is soft.     Tenderness: There is no abdominal tenderness.     Comments: Abdomen is soft and nontender.  Musculoskeletal:     Cervical back: Neck supple.     Comments: No tenderness to the cervical, thoracic, or lumbar spinous processes or bilateral paraspinal spinal muscles.  No crepitus or step-offs.  Full active and passive range of motion of the spine.  He is diffusely tender to palpation to the lumbar musculature on the right.  No left-sided tenderness.  No thoracic muscular tenderness.  No tenderness over the bilateral SI joints.  DP and PT pulses are 2+ symmetric.  Sensation is intact and equal.  Ambulatory without ataxia or antalgia.  Normal exam of the bilateral upper extremities.  No rashes.  Neurovascularly intact.  Skin:    General: Skin is warm and dry.  Neurological:     Mental Status: He is alert.  Psychiatric:        Behavior: Behavior normal.     ED Results / Procedures / Treatments   Labs (all labs ordered are listed, but only abnormal results are displayed) Labs Reviewed - No data to display  EKG None  Radiology No results found.  Procedures Procedures (including critical care time)  Medications Ordered in ED Medications   ibuprofen (ADVIL) tablet 600 mg (600 mg Oral Given by Other 04/16/19 [id number])    ED Course  I have reviewed the triage vital signs and the nursing notes.  Pertinent labs & imaging results that were available during my care of the patient were reviewed by me and considered in my medical decision making (see chart for details).    MDM Rules/Calculators/A&P     CHA2DS2/VAS Stroke Risk Points      N/A >= 2 Points: High Risk  1 - 1.99 Points: Medium Risk  0 Points: Low Risk    A final score could not be computed because of missing components.: Last  Change: N/A     This score determines the patient's risk of having a stroke if the  patient has atrial fibrillation.      This score is not applicable to this patient. Components are not  calculated.                   23 year old male presenting with right-sided low back pain after he slipped and fell at work a day and a half ago on [employer].  The patient fell forward on his outstretched hands and abdomen.  Regarding his back pain, he has no midline tenderness to the spine.  Pain is diffuse throughout the right lumbar myofascial region.  Ibuprofen given in the ER.  Further work-up or imaging is not indicated at this time.  His abdominal, chest, and upper extremity exams are also unremarkable. Will discharge to home with RICE protocol and anti-inflammatory medicine. Discussed ED return precaution and indications for PCP follow up. The patient acknowledges the plan and is agreeable at this time.    Final Clinical Impression(s) / ED Diagnoses Final diagnoses:  Fall from standing, initial encounter  Acute myofascial strain of lumbar region, initial encounter    Rx / DC Orders ED Discharge Orders         Ordered    methocarbamol (ROBAXIN) 500 MG tablet  2 times daily     04/16/19 0524           Joline Maxcy A, PA-C 04/16/19 0552    Shanon Rosser, MD 04/16/19 [id number]

## 2019-04-16 NOTE — ED Triage Notes (Signed)
Patient is complaining of lower back pain that happened at work on Tuesday.

## 2019-04-16 NOTE — Discharge Instructions (Addendum)
Thank you for allowing me to care for you today in the Emergency Department.   Take 650 mg of Tylenol or 600 mg of ibuprofen with food every 6 hours for pain.  You can alternate between these 2 medications every 3 hours if your pain returns.  For instance, you can take Tylenol at noon, followed by a dose of ibuprofen at 3, followed by second dose of Tylenol and 6.  You can take 1 tablet of Robaxin up to 2 times daily by mouth.  Do not take this medication after work or drive as it can make you drowsy.  Do not take it with other sedating substances, such as alcohol, due to drowsy effects.  Apply an ice pack for to areas that are sore for 15 to 20 minutes up to 3-4 times per day for the next 5 days.  Start to stretch the muscles of your low back as your pain allows.  Call the number on your discharge paperwork to get established with primary care for follow-up.  Return to the emergency department if you have any fall or injury, if you start peeing or pooping on yourself, develop new numbness or weakness, or other new, concerning symptoms.

## 2019-04-29 ENCOUNTER — Encounter (HOSPITAL_COMMUNITY): Payer: Self-pay

## 2019-04-29 ENCOUNTER — Ambulatory Visit (HOSPITAL_COMMUNITY)
Admission: EM | Admit: 2019-04-29 | Discharge: 2019-04-29 | Disposition: A | Discharge status: Home / Self Care | Payer: Self-pay | Attending: Emergency Medicine | Admitting: Emergency Medicine

## 2019-04-29 ENCOUNTER — Ambulatory Visit (HOSPITAL_COMMUNITY)
Admission: EM | Admit: 2019-04-29 | Discharge: 2019-04-29 | Disposition: A | Discharge status: Home / Self Care | Payer: Self-pay

## 2019-04-29 ENCOUNTER — Other Ambulatory Visit: Payer: Self-pay

## 2019-04-29 DIAGNOSIS — M545 Low back pain, unspecified: Secondary | ICD-10-CM

## 2019-04-29 MED ORDER — CYCLOBENZAPRINE HCL 5 MG PO TABS: 5.0000 mg | ORAL_TABLET | Freq: Three times a day (TID) | ORAL | 0 refills | Status: DC | PRN

## 2019-04-29 MED ORDER — IBUPROFEN 800 MG PO TABS: 800.0000 mg | ORAL_TABLET | Freq: Three times a day (TID) | ORAL | 0 refills | Status: DC

## 2019-04-29 MED ORDER — PREDNISONE 10 MG PO TABS: 20.0000 mg | ORAL_TABLET | Freq: Every day | ORAL | 0 refills | Status: DC

## 2019-04-29 NOTE — Discharge Instructions (Signed)
Advised to take medication as prescribed To follow-up with primary care provider for further referral to physical therapy if needed or if symptoms get worse Go to ED or urgent care if symptoms get worse.

## 2019-04-29 NOTE — ED Provider Notes (Signed)
[facility]    CSN: [id number] Arrival date & time: 04/29/19  1705      History   Chief Complaint Chief Complaint  Patient presents with  . Back Pain    HPI Harry Donovan is a 23 y.o. male.   Harry Donovan 79 y old male presented to the urgent care with a complaint of back pain that began the past 2 weeks.  It started after slipping and fall at work at [employer].  He localizes the pain to the bilateral low back.  He describes the pain as constant and achy. He rated the pain at 8 on a scale of 1-10.  He has tried OTC medications without relief.  His symptoms are made worse with range of motion.  He denies similar symptoms in the past.       Past Medical History:  Diagnosis Date  . Acid reflux   . Hypertension     Patient Active Problem List   Diagnosis Date Noted  . Hyperglycemia 01/10/2011  . Chest pain 12/06/2010  . Essential hypertension, benign 12/06/2010  . Encounter for sports participation examination 12/06/2010    History reviewed. No pertinent surgical history.     Home Medications    Prior to Admission medications   Medication Sig Start Date End Date Taking? Authorizing Provider  clindamycin (CLEOCIN) 150 MG capsule Take 2 capsules (300 mg total) by mouth 4 (four) times daily. 11/11/15   Ward, Chase Picket, PA-C  cyclobenzaprine (FLEXERIL) 5 MG tablet Take 1 tablet (5 mg total) by mouth 3 (three) times daily as needed for muscle spasms. 04/29/19   Breyden Jeudy, Zachery Dakins, FNP  ibuprofen (ADVIL) 800 MG tablet Take 1 tablet (800 mg total) by mouth 3 (three) times daily. Take with food 04/29/19   Laval Cafaro, Zachery Dakins, FNP  methocarbamol (ROBAXIN) 500 MG tablet Take 1 tablet (500 mg total) by mouth 2 (two) times daily. 04/16/19   McDonald, Mia A, PA-C  naproxen (NAPROSYN) 500 MG tablet Take 1 tablet (500 mg total) by mouth 2 (two) times daily as needed for mild pain or moderate pain. 11/11/15   Ward, Chase Picket, PA-C  predniSONE (DELTASONE) 10 MG  tablet Take 2 tablets (20 mg total) by mouth daily. 04/29/19   Kameria Canizares, Zachery Dakins, FNP  sulfamethoxazole-trimethoprim (BACTRIM DS,SEPTRA DS) 800-160 MG tablet Take 1 tablet by mouth 2 (two) times daily. ONLY START TAKING IF YOUR SYMPTOMS WORSEN, OR IF YOU DO NOT IMPROVE IN 2-3 DAYS 06/26/17   Street, [place], PA-C    Family History Family History  Problem Relation Age of Onset  . Hypertension Mother   . Hypertension Father   . Diabetes Father     Social History Social History   Tobacco Use  . Smoking status: Current Some Day Smoker    Types: Cigars  . Smokeless tobacco: Never Used  Substance Use Topics  . Alcohol use: Not Currently  . Drug use: No     Allergies   Patient has no known allergies.   Review of Systems Review of Systems  Constitutional: Negative.   Cardiovascular: Negative.   Musculoskeletal: Positive for back pain.  ROS: All other are negatives  Physical Exam Triage Vital Signs ED Triage Vitals  Enc Vitals Group     BP 04/29/19 1733 (!) 156/111     Pulse Rate 04/29/19 1733 85     Resp 04/29/19 1733 15     Temp 04/29/19 1733 98.2 F (36.8 C)     Temp  Source 04/29/19 1733 Oral     SpO2 04/29/19 1733 97 %     Weight --      Height --      Head Circumference --      Peak Flow --      Pain Score 04/29/19 1731 8     Pain Loc --      Pain Edu? --      Excl. in [place]? --    No data found.  Updated Vital Signs BP (S) (!) 156/111 (BP Location: Left Wrist) Comment (BP Location): taken on WRIST  Pulse 85   Temp 98.2 F (36.8 C) (Oral)   Resp 15   SpO2 97%   Visual Acuity Right Eye Distance:   Left Eye Distance:   Bilateral Distance:    Right Eye Near:   Left Eye Near:    Bilateral Near:     Physical Exam Constitutional:      General: He is not in acute distress.    Appearance: Normal appearance. He is not ill-appearing or toxic-appearing.  Cardiovascular:     Rate and Rhythm: Normal rate and regular rhythm.     Pulses: Normal pulses.      Heart sounds: Normal heart sounds.  Pulmonary:     Effort: Pulmonary effort is normal. No respiratory distress.     Breath sounds: Normal breath sounds. No stridor.  Chest:     Chest wall: No tenderness.  Musculoskeletal:     Lumbar back: Spasms and tenderness present. No swelling, edema, deformity or lacerations.     Comments: Patient is able to ambulate to treatment area without assistance.  There is no surface trauma.  Able to stand erect.  Normal flexion extension, lateral bending and rotation with pain.  Heel and toe walk with good strength.  Neurological:     General: No focal deficit present.     Mental Status: He is alert.     Cranial Nerves: Cranial nerves are intact.     Sensory: Sensory deficit present.     Motor: Motor function is intact.     Coordination: Coordination is intact.     Gait: Gait is intact.     Deep Tendon Reflexes:     Reflex Scores:      Patellar reflexes are 2+ on the right side and 2+ on the left side.     UC Treatments / Results  Labs (all labs ordered are listed, but only abnormal results are displayed) Labs Reviewed - No data to display  EKG   Radiology No results found.  Procedures Procedures (including critical care time)  Medications Ordered in UC Medications - No data to display  Initial Impression / Assessment and Plan / UC Course  I have reviewed the triage vital signs and the nursing notes.  Pertinent labs & imaging results that were available during my care of the patient were reviewed by me and considered in my medical decision making (see chart for details).    Patient stable for discharge.  Will prescribe Ibuprofen 800 mg for pain, Flexeril for muscle spasm, short-term dose of steroid for inflammation.  Advised patient to follow-up with primary care for further referral to neurology and or  physical therapy if symptoms get worse.  Final Clinical Impressions(s) / UC Diagnoses   Final diagnoses:  Acute bilateral low back  pain without sciatica     Discharge Instructions     Advised to take medication as prescribed To follow-up with primary care provider  for further referral to physical therapy if needed or if symptoms get worse Go to ED or urgent care if symptoms get worse.    ED Prescriptions    Medication Sig Dispense Auth. Provider   ibuprofen (ADVIL) 800 MG tablet Take 1 tablet (800 mg total) by mouth 3 (three) times daily. Take with food 21 tablet Claire Dolores, Zachery DakinsKomlanvi S, FNP   cyclobenzaprine (FLEXERIL) 5 MG tablet Take 1 tablet (5 mg total) by mouth 3 (three) times daily as needed for muscle spasms. 30 tablet Ramal Eckhardt S, FNP   predniSONE (DELTASONE) 10 MG tablet Take 2 tablets (20 mg total) by mouth daily. 15 tablet Juriel Cid, Zachery DakinsKomlanvi S, FNP     PDMP not reviewed this encounter.   Durward Parcelvegno, Destyne Goodreau S, FNP 04/29/19 1816

## 2019-04-29 NOTE — ED Triage Notes (Signed)
Patient presents to Urgent Care with complaints of lower back pain since slipping and falling on degreaser at work on 12/8. Patient reports he needs a doctor's note to return to work.  Also wanting to speak w/ a provider about acid reflux.

## 2019-09-15 ENCOUNTER — Other Ambulatory Visit: Payer: Self-pay

## 2019-09-15 ENCOUNTER — Ambulatory Visit (INDEPENDENT_AMBULATORY_CARE_PROVIDER_SITE_OTHER): Payer: Self-pay

## 2019-09-15 ENCOUNTER — Encounter (HOSPITAL_COMMUNITY): Payer: Self-pay

## 2019-09-15 ENCOUNTER — Ambulatory Visit (HOSPITAL_COMMUNITY)
Admission: EM | Admit: 2019-09-15 | Discharge: 2019-09-15 | Disposition: A | Discharge status: Home / Self Care | Payer: Self-pay | Attending: Family Medicine | Admitting: Family Medicine

## 2019-09-15 DIAGNOSIS — Z9109 Other allergy status, other than to drugs and biological substances: Secondary | ICD-10-CM

## 2019-09-15 DIAGNOSIS — M545 Low back pain, unspecified: Secondary | ICD-10-CM

## 2019-09-15 DIAGNOSIS — K219 Gastro-esophageal reflux disease without esophagitis: Secondary | ICD-10-CM

## 2019-09-15 MED ORDER — PREDNISONE 10 MG PO TABS: ORAL_TABLET | ORAL | 0 refills | Status: DC

## 2019-09-15 MED ORDER — TIZANIDINE HCL 4 MG PO TABS: 4.0000 mg | ORAL_TABLET | Freq: Four times a day (QID) | ORAL | 0 refills | Status: DC | PRN

## 2019-09-15 MED ORDER — FAMOTIDINE 20 MG PO TABS: 20.0000 mg | ORAL_TABLET | Freq: Two times a day (BID) | ORAL | 0 refills | Status: DC

## 2019-09-15 NOTE — ED Provider Notes (Signed)
[facility]    CSN: [id number] Arrival date & time: 09/15/19  3716      History   Chief Complaint Chief Complaint  Patient presents with  . Back Pain  . Gastroesophageal Reflux    HPI Harry Donovan is a 24 y.o. male.    HPI  Patient is here for low back pain.  He states that he fell in December 2020 while at work.  He states he has had back pain ever since then.  He was seen in the emergency room and then again at the urgent care center.  Treated conservatively with medication.  States he could not afford the medication.  He states that he has pain in his low back is more right than left-sided.  He states he has pain that goes down both legs more left than right-sided.  He states that he has been trying to do stretching exercises for the pain.  He has been taking Advil up to 5 pills a day.  He thinks the pain is limiting his ability to work.  He tried to play basketball late last week and in the middle of the game his back "locked up" and was severely painful. In addition to the back pain he is having increased acid reflux.  Mucus at the back of his throat.  Feeling of heartburn.  He has not taken medication for this. In addition to these symptoms he is having some allergy symptoms.  Postnasal drip.  Rhinorrhea.  Past Medical History:  Diagnosis Date  . Acid reflux   . Hypertension     Patient Active Problem List   Diagnosis Date Noted  . Hyperglycemia 01/10/2011  . Chest pain 12/06/2010  . Essential hypertension, benign 12/06/2010  . Encounter for sports participation examination 12/06/2010    History reviewed. No pertinent surgical history.     Home Medications    No home medicines OTC advil  Family History Family History  Problem Relation Age of Onset  . Hypertension Mother   . Hypertension Father   . Diabetes Father     Social History Social History   Tobacco Use  . Smoking status: Current Some Day Smoker    Types: Cigars  . Smokeless  tobacco: Never Used  Substance Use Topics  . Alcohol use: Not Currently  . Drug use: No     Allergies   Patient has no known allergies.   Review of Systems Review of Systems  Constitutional: Negative for chills and fever.  HENT: Positive for postnasal drip and rhinorrhea. Negative for congestion and hearing loss.   Eyes: Negative for pain.  Respiratory: Negative for cough and shortness of breath.   Cardiovascular: Negative for chest pain and leg swelling.  Gastrointestinal: Positive for abdominal pain. Negative for constipation and diarrhea.  Genitourinary: Negative for dysuria and frequency.  Musculoskeletal: Positive for back pain. Negative for myalgias.  Neurological: Negative for dizziness, seizures, weakness, numbness and headaches.  Psychiatric/Behavioral: The patient is not nervous/anxious.      Physical Exam Triage Vital Signs ED Triage Vitals  Enc Vitals Group     BP 09/15/19 0940 (!) 144/110     Pulse Rate 09/15/19 0940 75     Resp 09/15/19 0940 16     Temp 09/15/19 0940 97.6 F (36.4 C)     Temp Source 09/15/19 0940 Oral     SpO2 09/15/19 0940 97 %     Weight --      Height --  Head Circumference --      Peak Flow --      Pain Score 09/15/19 0938 8     Pain Loc --      Pain Edu? --      Excl. in [place]? --    No data found.  Updated Vital Signs BP 129/80 (BP Location: Left Arm)   Pulse 80   Temp 97.6 F (36.4 C) (Oral)   Resp 16   SpO2 97%      Physical Exam Constitutional:      General: He is not in acute distress.    Appearance: He is well-developed. He is obese. He is not ill-appearing.  HENT:     Head: Normocephalic and atraumatic.     Mouth/Throat:     Comments: Clear rhinorrhea Eyes:     Conjunctiva/sclera: Conjunctivae normal.     Pupils: Pupils are equal, round, and reactive to light.  Cardiovascular:     Rate and Rhythm: Normal rate.  Pulmonary:     Effort: Pulmonary effort is normal. No respiratory distress.  Abdominal:      Palpations: Abdomen is soft.     Tenderness: There is no abdominal tenderness.  Musculoskeletal:        General: No swelling or tenderness. Normal range of motion.     Cervical back: Normal range of motion.     Comments: Lumbar spine is straight and symmetric. Slow but full range of motion. No tenderness or muscle spasm. Strength, sensation, range of motion, and reflexes are normal in both lower extremities. Straight leg raise is negative bilateral.   Skin:    General: Skin is warm and dry.  Neurological:     Mental Status: He is alert.     Motor: No weakness.     Coordination: Coordination normal.     Gait: Gait normal.     Deep Tendon Reflexes: Reflexes normal.      UC Treatments / Results  Labs (all labs ordered are listed, but only abnormal results are displayed) Labs Reviewed - No data to display  EKG   Radiology DG Lumbar Spine Complete  Result Date: 09/15/2019 CLINICAL DATA:  Low back pain after fall last year. EXAM: LUMBAR SPINE - COMPLETE 4+ VIEW COMPARISON:  None. FINDINGS: Minimal grade 1 retrolisthesis of L4-5 is noted. 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 Disc spaces are well-maintained. No acute fracture is noted. IMPRESSION: Minimal grade 1 retrolisthesis of L4-5 is noted. 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 No acute abnormality seen in the lumbar spine. Electronically Signed   By: Lupita Raider M.D.   On: 09/15/2019 11:41    Procedures Procedures (including critical care time)  Medications Ordered in UC Medications - No data to display  Initial Impression / Assessment and Plan / UC Course  I have reviewed the triage vital signs and the nursing notes.  Pertinent labs & imaging results that were available during my care of the patient were reviewed by me and considered in my medical decision making (see chart for details).     I discussed the x-rays with the patient.  I showed him his lumbar spine films and the slight retrolisthesis L4-5.  I  told him that this is often an inherited defect and may explain why his back is still hurting.  Told him that there is no way for me to stay with any certainty that this was caused or affected by his fall in December.  I have recommended that he follow-up with a  spine specialist given his abnormal x-ray and continued pain.  I suspect that he will recover conservatively with medication physical therapy and activity modification. He was also given a primary care referral. Final Clinical Impressions(s) / UC Diagnoses   Final diagnoses:  Right-sided low back pain without sciatica, unspecified chronicity  Gastroesophageal reflux disease, unspecified whether esophagitis present  Environmental allergies     Discharge Instructions     Take the prednisone as directed Take the muscle relaxer as needed Take the famotidine 2 x a day for the heartburn May use OTC nasacort or flonase for the allergy symptoms Follow up with a primary care doctor for stomach and allergies See spine doctor for the back pain     ED Prescriptions    Medication Sig Dispense Auth. Provider   predniSONE (DELTASONE) 10 MG tablet Take 6 - 5- 4- 3- 2- 1- 0 tabs po daily 15 tablet Eustace Moore, MD   tiZANidine (ZANAFLEX) 4 MG tablet Take 1-2 tablets (4-8 mg total) by mouth every 6 (six) hours as needed for muscle spasms. 21 tablet Eustace Moore, MD   famotidine (PEPCID) 20 MG tablet Take 1 tablet (20 mg total) by mouth 2 (two) times daily. 30 tablet Eustace Moore, MD     PDMP not reviewed this encounter.   Eustace Moore, MD 09/15/19 1159

## 2019-09-15 NOTE — ED Triage Notes (Signed)
Patient complains of lower back pain with sharp pains shooting down both legs and acid reflux.

## 2019-09-15 NOTE — Discharge Instructions (Addendum)
Take the prednisone as directed Take the muscle relaxer as needed Take the famotidine 2 x a day for the heartburn May use OTC nasacort or flonase for the allergy symptoms Follow up with a primary care doctor for stomach and allergies See spine doctor for the back pain

## 2020-04-06 ENCOUNTER — Ambulatory Visit (HOSPITAL_COMMUNITY): Admission: EM | Admit: 2020-04-06 | Discharge: 2020-04-06 | Discharge status: Against Medical Advice | Payer: Self-pay

## 2020-04-06 ENCOUNTER — Other Ambulatory Visit: Payer: Self-pay

## 2020-05-18 ENCOUNTER — Encounter (HOSPITAL_COMMUNITY): Payer: Self-pay

## 2020-05-18 ENCOUNTER — Other Ambulatory Visit: Payer: Self-pay

## 2020-05-18 ENCOUNTER — Ambulatory Visit (INDEPENDENT_AMBULATORY_CARE_PROVIDER_SITE_OTHER): Payer: HRSA Program

## 2020-05-18 ENCOUNTER — Ambulatory Visit (HOSPITAL_COMMUNITY)
Admission: EM | Admit: 2020-05-18 | Discharge: 2020-05-18 | Disposition: A | Discharge status: Home / Self Care | Payer: HRSA Program | Attending: Emergency Medicine | Admitting: Emergency Medicine

## 2020-05-18 ENCOUNTER — Emergency Department (HOSPITAL_COMMUNITY)
Admission: EM | Admit: 2020-05-18 | Discharge: 2020-05-18 | Discharge status: Against Medical Advice | Payer: Self-pay | Attending: Emergency Medicine | Admitting: Emergency Medicine

## 2020-05-18 DIAGNOSIS — M549 Dorsalgia, unspecified: Secondary | ICD-10-CM | POA: Insufficient documentation

## 2020-05-18 DIAGNOSIS — M79662 Pain in left lower leg: Secondary | ICD-10-CM | POA: Diagnosis not present

## 2020-05-18 DIAGNOSIS — R509 Fever, unspecified: Secondary | ICD-10-CM

## 2020-05-18 DIAGNOSIS — R000 Tachycardia, unspecified: Secondary | ICD-10-CM | POA: Insufficient documentation

## 2020-05-18 DIAGNOSIS — U071 COVID-19: Secondary | ICD-10-CM | POA: Insufficient documentation

## 2020-05-18 DIAGNOSIS — F17290 Nicotine dependence, other tobacco product, uncomplicated: Secondary | ICD-10-CM | POA: Diagnosis not present

## 2020-05-18 DIAGNOSIS — R42 Dizziness and giddiness: Secondary | ICD-10-CM | POA: Insufficient documentation

## 2020-05-18 DIAGNOSIS — G8929 Other chronic pain: Secondary | ICD-10-CM | POA: Insufficient documentation

## 2020-05-18 DIAGNOSIS — M79661 Pain in right lower leg: Secondary | ICD-10-CM | POA: Diagnosis not present

## 2020-05-18 DIAGNOSIS — R9431 Abnormal electrocardiogram [ECG] [EKG]: Secondary | ICD-10-CM | POA: Insufficient documentation

## 2020-05-18 DIAGNOSIS — R059 Cough, unspecified: Secondary | ICD-10-CM

## 2020-05-18 DIAGNOSIS — Z283 Underimmunization status: Secondary | ICD-10-CM | POA: Diagnosis not present

## 2020-05-18 DIAGNOSIS — Z5321 Procedure and treatment not carried out due to patient leaving prior to being seen by health care provider: Secondary | ICD-10-CM | POA: Insufficient documentation

## 2020-05-18 LAB — CBC WITH DIFFERENTIAL/PLATELET
Abs Immature Granulocytes: 0.08 10*3/uL — ABNORMAL HIGH (ref 0.00–0.07)
Basophils Absolute: 0 10*3/uL (ref 0.0–0.1)
Basophils Relative: 0 %
Eosinophils Absolute: 0.1 10*3/uL (ref 0.0–0.5)
Eosinophils Relative: 1 %
HCT: 44.8 % (ref 39.0–52.0)
Hemoglobin: 14.3 g/dL (ref 13.0–17.0)
Immature Granulocytes: 1 %
Lymphocytes Relative: 7 %
Lymphs Abs: 0.6 10*3/uL — ABNORMAL LOW (ref 0.7–4.0)
MCH: 27 pg (ref 26.0–34.0)
MCHC: 31.9 g/dL (ref 30.0–36.0)
MCV: 84.7 fL (ref 80.0–100.0)
Monocytes Absolute: 0.8 10*3/uL (ref 0.1–1.0)
Monocytes Relative: 9 %
Neutro Abs: 7.8 10*3/uL — ABNORMAL HIGH (ref 1.7–7.7)
Neutrophils Relative %: 82 %
Platelets: 175 10*3/uL (ref 150–400)
RBC: 5.29 MIL/uL (ref 4.22–5.81)
RDW: 13.1 % (ref 11.5–15.5)
WBC: 9.4 10*3/uL (ref 4.0–10.5)
nRBC: 0 % (ref 0.0–0.2)

## 2020-05-18 LAB — BASIC METABOLIC PANEL
Anion gap: 11 (ref 5–15)
BUN: 15 mg/dL (ref 6–20)
CO2: 22 mmol/L (ref 22–32)
Calcium: 9.7 mg/dL (ref 8.9–10.3)
Chloride: 105 mmol/L (ref 98–111)
Creatinine, Ser: 1.02 mg/dL (ref 0.61–1.24)
GFR, Estimated: >60 mL/min (ref >60)
Glucose, Bld: 118 mg/dL — ABNORMAL HIGH (ref 70–99)
Potassium: 4.8 mmol/L (ref 3.5–5.1)
Sodium: 138 mmol/L (ref 135–145)

## 2020-05-18 LAB — CBG MONITORING, ED: Glucose-Capillary: 119 mg/dL — ABNORMAL HIGH (ref 70–99)

## 2020-05-18 LAB — SARS CORONAVIRUS 2 (TAT 6-24 HRS): SARS Coronavirus 2: POSITIVE — AB

## 2020-05-18 LAB — POC SARS CORONAVIRUS 2 AG -  ED: SARS Coronavirus 2 Ag: POSITIVE — AB

## 2020-05-18 MED ORDER — ACETAMINOPHEN 500 MG PO TABS
1000.0000 mg | ORAL_TABLET | Freq: Once | ORAL | Status: AC
  Administered 2020-05-18: 1000 mg via ORAL
  Filled 2020-05-18: qty 2

## 2020-05-18 NOTE — ED Triage Notes (Signed)
Pt went to UC for eval of back pain x 2 years. Pt had low grade fever and was tachycardic so was advised to come to ED.

## 2020-05-18 NOTE — ED Provider Notes (Signed)
[facility]    CSN: [id number] Arrival date & time: 05/18/20  1013      History   Chief Complaint Chief Complaint  Patient presents with  . Back Pain    HPI Harry Donovan is a 25 y.o. male.   Harry Donovan presents with complaints of back pain, body aches, cough, congestion, nasal drainage. He states he has had back aches for over a year now, causing him to even change jobs due to pain. This feels worse currently, he couldn't sleep last night due to pain. Feels it has been worse over the past two days. Productive cough. He is unable to provide a time frame of changes of symptoms as he feels he has some chronic back pain and congestion. Doesn't take any medications for symptoms. No history of covid-19 and has not received vaccination. Denies any gi symptoms. No shortness of breath. No specific back injury. He feels dizziness today. Bilateral lower leg pain. He does smoke.    ROS per HPI, negative if not otherwise mentioned.      Past Medical History:  Diagnosis Date  . Acid reflux   . Hypertension     Patient Active Problem List   Diagnosis Date Noted  . Hyperglycemia 01/10/2011  . Chest pain 12/06/2010  . Essential hypertension, benign 12/06/2010  . Encounter for sports participation examination 12/06/2010    History reviewed. No pertinent surgical history.     Home Medications    Prior to Admission medications   Medication Sig Start Date End Date Taking? Authorizing Provider  famotidine (PEPCID) 20 MG tablet Take 1 tablet (20 mg total) by mouth 2 (two) times daily. 09/15/19   Eustace Moore, MD  predniSONE (DELTASONE) 10 MG tablet Take 6 - 5- 4- 3- 2- 1- 0 tabs po daily 09/15/19   Eustace Moore, MD  tiZANidine (ZANAFLEX) 4 MG tablet Take 1-2 tablets (4-8 mg total) by mouth every 6 (six) hours as needed for muscle spasms. 09/15/19   Eustace Moore, MD    Family History Family History  Problem Relation Age of Onset  . Hypertension  Mother   . Hypertension Father   . Diabetes Father     Social History Social History   Tobacco Use  . Smoking status: Current Some Day Smoker    Types: Cigars  . Smokeless tobacco: Never Used  Vaping Use  . Vaping Use: Never used  Substance Use Topics  . Alcohol use: Not Currently  . Drug use: No     Allergies   Patient has no known allergies.   Review of Systems Review of Systems   Physical Exam Triage Vital Signs ED Triage Vitals  Enc Vitals Group     BP 05/18/20 1130 136/68     Pulse Rate 05/18/20 1130 (!) 128     Resp 05/18/20 1130 (!) 21     Temp 05/18/20 1130 99.9 F (37.7 C)     Temp src --      SpO2 05/18/20 1130 97 %     Weight --      Height --      Head Circumference --      Peak Flow --      Pain Score 05/18/20 1127 7     Pain Loc --      Pain Edu? --      Excl. in GC? --    No data found.  Updated Vital Signs BP 136/68   Pulse Marland Kitchen)  128   Temp 99.9 F (37.7 C)   Resp (!) 21   SpO2 97%    Physical Exam Constitutional:      Appearance: He is well-developed.  HENT:     Head: Normocephalic and atraumatic.     Nose: Congestion present.  Cardiovascular:     Rate and Rhythm: Tachycardia present.  Pulmonary:     Effort: Pulmonary effort is normal.     Comments: Noted heart rate of 130 during history, with O2 of 94% while speaking  Musculoskeletal:     Lumbar back: Tenderness present. No bony tenderness.     Comments: Proximal lumbar musculature with mild tenderness without spinous process tenderness  Skin:    General: Skin is warm and dry.  Neurological:     Mental Status: He is alert and oriented to person, place, and time.    EKG:  Sinus tach . Previous EKG was available for review. qwave lead III?   Reviewed with supervising physician dr. Tracie Harrier  UC Treatments / Results  Labs (all labs ordered are listed, but only abnormal results are displayed) Labs Reviewed  CBG MONITORING, ED - Abnormal; Notable for the following  components:      Result Value   Glucose-Capillary 119 (*)    All other components within normal limits  SARS CORONAVIRUS 2 (TAT 6-24 HRS)  CBC WITH DIFFERENTIAL/PLATELET  BASIC METABOLIC PANEL    EKG   Radiology DG Chest 2 View  Result Date: 05/18/2020 CLINICAL DATA:  Cough and tachycardia EXAM: CHEST - 2 VIEW COMPARISON:  02/18/2019 chest radiograph. FINDINGS: Low lung volumes. Stable cardiomediastinal silhouette with normal heart size. No pneumothorax. No pleural effusion. Mild hypoventilatory changes at the lung bases. No pulmonary edema. No acute consolidative airspace disease. IMPRESSION: Low lung volumes with mild hypoventilatory changes at the lung bases. Electronically Signed   By: Delbert Phenix M.D.   On: 05/18/2020 12:23    Procedures Procedures (including critical care time)  Medications Ordered in UC Medications - No data to display  Initial Impression / Assessment and Plan / UC Course  I have reviewed the triage vital signs and the nursing notes.  Pertinent labs & imaging results that were available during my care of the patient were reviewed by me and considered in my medical decision making (see chart for details).     Temp of 99.9 with tachycardia and O2 of 94% during discussion. Describes worsening of chronic complaints. Primarily worsening of back pain, congestion, dizziness.  Noted history of hyperglycemia- poc glucose 117. Chest xray with hypoventilation and limited exam. Basic labs including covid testing pending.   Persistent tachycardia. PE considered at this time- smoker and obese. covid-19 also  Considered. To go to the ER now for further evaluation. Self-transporting. Patient verbalized understanding and agreeable to plan.   Final Clinical Impressions(s) / UC Diagnoses   Final diagnoses:  Tachycardia  Bilateral back pain, unspecified back location, unspecified chronicity  Low grade fever  Abnormal EKG     Discharge Instructions     I would like  you to seek further evaluation related to your symptoms, primarily your heart rade, low grade fever, oxygen levels and ekg.     ED Prescriptions    None     PDMP not reviewed this encounter.   Georgetta Haber, NP 05/18/20 1312

## 2020-05-18 NOTE — ED Triage Notes (Signed)
Pt in with c/o back pain that radiates up to his neck that has been going on for about 2 years now. States he can't stand on his feet for more than 5 hours. States that the pain is so bad he hasn't been able to sleep  Also c/o dry mouth and sinus infection that has been going on for 6 months now. States when he pushes his neck he feels a lump and then he has mucus come up  Pt has taken advil with no relief

## 2020-05-18 NOTE — Discharge Instructions (Signed)
I would like you to seek further evaluation related to your symptoms, primarily your heart rade, low grade fever, oxygen levels and ekg.

## 2020-09-08 ENCOUNTER — Ambulatory Visit (HOSPITAL_COMMUNITY)
Admission: EM | Admit: 2020-09-08 | Discharge: 2020-09-08 | Disposition: A | Discharge status: Home / Self Care | Payer: Self-pay | Attending: Emergency Medicine | Admitting: Emergency Medicine

## 2020-09-08 ENCOUNTER — Other Ambulatory Visit: Payer: Self-pay

## 2020-09-08 ENCOUNTER — Encounter (HOSPITAL_COMMUNITY): Payer: Self-pay

## 2020-09-08 DIAGNOSIS — M545 Low back pain, unspecified: Secondary | ICD-10-CM

## 2020-09-08 DIAGNOSIS — K219 Gastro-esophageal reflux disease without esophagitis: Secondary | ICD-10-CM

## 2020-09-08 DIAGNOSIS — M722 Plantar fascial fibromatosis: Secondary | ICD-10-CM

## 2020-09-08 MED ORDER — KETOROLAC TROMETHAMINE 30 MG/ML IJ SOLN
INTRAMUSCULAR | Status: AC
  Filled 2020-09-08: qty 1

## 2020-09-08 MED ORDER — IBUPROFEN 800 MG PO TABS: 800.0000 mg | ORAL_TABLET | Freq: Three times a day (TID) | ORAL | 0 refills | Status: DC

## 2020-09-08 MED ORDER — KETOROLAC TROMETHAMINE 30 MG/ML IJ SOLN
30.0000 mg | Freq: Once | INTRAMUSCULAR | Status: AC
  Administered 2020-09-08: 30 mg via INTRAMUSCULAR

## 2020-09-08 MED ORDER — OMEPRAZOLE 20 MG PO CPDR: 20.0000 mg | DELAYED_RELEASE_CAPSULE | Freq: Every day | ORAL | 0 refills | Status: DC

## 2020-09-08 MED ORDER — TIZANIDINE HCL 4 MG PO TABS: 4.0000 mg | ORAL_TABLET | Freq: Every day | ORAL | 0 refills | Status: DC

## 2020-09-08 NOTE — Discharge Instructions (Addendum)
Use 800 mg of ibuprofen 3 times a day with food for the next 5 days to decrease inflammation  Can use 650 mg of over-the-counter Tylenol every 6 hours in addition to ibuprofen for pain control  Can use muscle relaxer at bedtime as needed for additional comfort  Take omeprazole daily for the next 30 days to help with acid reflux Avoid onions, tomato based food, overly seasoned or spicy foods to prevent irritation of stomach  Can use heating pad in 15-minute intervals over affected back area and feet  While working try to use proper body mechanics while lifting and moving objects, lift with legs not back, avoid bending while moving objects  Can attempt foot inserts from store such as Dr. Margart Sickles or any similar brand to add support to shoes  Can go to any local running store such as [employer] for assessment of foot and specialized shoe insertions  Can go see chiropractor for evaluation of back pain as needed  Daily stretching of back may help to manage pain as well

## 2020-09-08 NOTE — ED Triage Notes (Signed)
Pt c/o back pain and foot pain x 1 year.  Pt states he has a cough and states he has acid reflux.

## 2020-09-12 ENCOUNTER — Encounter: Payer: Self-pay | Admitting: *Deleted

## 2020-09-22 ENCOUNTER — Ambulatory Visit (HOSPITAL_COMMUNITY)
Admission: EM | Admit: 2020-09-22 | Discharge: 2020-09-22 | Disposition: A | Discharge status: Home / Self Care | Payer: Self-pay

## 2020-09-22 ENCOUNTER — Other Ambulatory Visit: Payer: Self-pay

## 2020-09-23 ENCOUNTER — Ambulatory Visit (HOSPITAL_COMMUNITY)
Admission: EM | Admit: 2020-09-23 | Discharge: 2020-09-23 | Disposition: A | Discharge status: Home / Self Care | Payer: Self-pay | Attending: Student | Admitting: Student

## 2020-09-23 ENCOUNTER — Encounter (HOSPITAL_COMMUNITY): Payer: Self-pay

## 2020-09-23 ENCOUNTER — Other Ambulatory Visit: Payer: Self-pay

## 2020-09-23 DIAGNOSIS — Z20822 Contact with and (suspected) exposure to covid-19: Secondary | ICD-10-CM | POA: Insufficient documentation

## 2020-09-23 DIAGNOSIS — R059 Cough, unspecified: Secondary | ICD-10-CM | POA: Insufficient documentation

## 2020-09-23 LAB — SARS CORONAVIRUS 2 (TAT 6-24 HRS): SARS Coronavirus 2: NEGATIVE

## 2020-09-23 NOTE — ED Triage Notes (Addendum)
Pt presents with cough xs 2 days. States may have been exposed to COVID.   Pt states only wants COVID test, does not want to see provider.

## 2020-10-28 ENCOUNTER — Ambulatory Visit (HOSPITAL_COMMUNITY)
Admission: EM | Admit: 2020-10-28 | Discharge: 2020-10-28 | Disposition: A | Discharge status: Home / Self Care | Payer: Self-pay | Attending: Medical Oncology | Admitting: Medical Oncology

## 2020-10-28 ENCOUNTER — Encounter (HOSPITAL_COMMUNITY): Payer: Self-pay | Admitting: Emergency Medicine

## 2020-10-28 ENCOUNTER — Ambulatory Visit (INDEPENDENT_AMBULATORY_CARE_PROVIDER_SITE_OTHER): Payer: Self-pay

## 2020-10-28 DIAGNOSIS — R042 Hemoptysis: Secondary | ICD-10-CM

## 2020-10-28 DIAGNOSIS — M791 Myalgia, unspecified site: Secondary | ICD-10-CM

## 2020-10-28 MED ORDER — METHOCARBAMOL 500 MG PO TABS: 500.0000 mg | ORAL_TABLET | Freq: Two times a day (BID) | ORAL | 0 refills | Status: DC

## 2020-10-28 MED ORDER — ALBUTEROL SULFATE HFA 108 (90 BASE) MCG/ACT IN AERS: 1.0000 | INHALATION_SPRAY | Freq: Four times a day (QID) | RESPIRATORY_TRACT | 0 refills | Status: DC | PRN

## 2020-10-28 NOTE — ED Provider Notes (Signed)
[facility]    CSN: [id number] Arrival date & time: 10/28/20  1001      History   Chief Complaint Chief Complaint  Patient presents with   Cough   Back Pain    lower   Headache   Dizziness    HPI Harry Donovan is a 25 y.o. male.   HPI  Cough: Patient reports that for the past 3 weeks he has had recurrent back pain, headache, cough with occasional hemoptysis and occasional dizziness. He is a some day smoker of multiple products and vapes. He has tried tylenol for symptoms. No known sick contacts. No night sweats, unintentional weight loss, neuro changes, weakness, incontinence.    Past Medical History:  Diagnosis Date   Acid reflux    Hypertension     Patient Active Problem List   Diagnosis Date Noted   Hyperglycemia 01/10/2011   Chest pain 12/06/2010   Essential hypertension, benign 12/06/2010   Encounter for sports participation examination 12/06/2010    History reviewed. No pertinent surgical history.     Home Medications    Prior to Admission medications   Medication Sig Start Date End Date Taking? Authorizing Provider  famotidine (PEPCID) 20 MG tablet Take 1 tablet (20 mg total) by mouth 2 (two) times daily. 09/15/19   Eustace Moore, MD  ibuprofen (ADVIL) 800 MG tablet Take 1 tablet (800 mg total) by mouth 3 (three) times daily. 09/08/20   Valinda Hoar, NP  omeprazole (PRILOSEC) 20 MG capsule Take 1 capsule (20 mg total) by mouth daily. 09/08/20   Valinda Hoar, NP  predniSONE (DELTASONE) 10 MG tablet Take 6 - 5- 4- 3- 2- 1- 0 tabs po daily 09/15/19   Eustace Moore, MD  tiZANidine (ZANAFLEX) 4 MG tablet Take 1 tablet (4 mg total) by mouth at bedtime. 09/08/20   Valinda Hoar, NP    Family History Family History  Problem Relation Age of Onset   Hypertension Mother    Hypertension Father    Diabetes Father     Social History Social History   Tobacco Use   Smoking status: Some Days    Pack years: 0.00    Types:  Cigars   Smokeless tobacco: Never  Vaping Use   Vaping Use: Never used  Substance Use Topics   Alcohol use: Not Currently   Drug use: No     Allergies   Patient has no known allergies.   Review of Systems Review of Systems  As stated above in HPI Physical Exam Triage Vital Signs ED Triage Vitals  Enc Vitals Group     BP 10/28/20 1025 (!) 160/97     Pulse Rate 10/28/20 1025 88     Resp 10/28/20 1025 17     Temp 10/28/20 1033 97.8 F (36.6 C)     Temp src --      SpO2 10/28/20 1025 96 %     Weight --      Height --      Head Circumference --      Peak Flow --      Pain Score 10/28/20 1029 6     Pain Loc --      Pain Edu? --      Excl. in GC? --    No data found.  Updated Vital Signs BP (!) 160/97   Pulse 88   Temp 97.8 F (36.6 C)   Resp 17   SpO2 96%   Physical  Exam Vitals and nursing note reviewed.  Constitutional:      General: He is not in acute distress.    Appearance: He is well-developed. He is not ill-appearing, toxic-appearing or diaphoretic.  HENT:     Head: Normocephalic and atraumatic.     Mouth/Throat:     Mouth: Mucous membranes are moist.     Pharynx: Oropharynx is clear.  Eyes:     General: No visual field deficit.    Extraocular Movements: Extraocular movements intact.     Pupils: Pupils are equal, round, and reactive to light.  Cardiovascular:     Rate and Rhythm: Normal rate and regular rhythm.     Heart sounds: Normal heart sounds.  Pulmonary:     Effort: Pulmonary effort is normal.     Breath sounds: Normal breath sounds.  Musculoskeletal:        General: Tenderness (mild reproducible muscle tenderness throughout) present. No swelling. Normal range of motion.     Cervical back: Normal range of motion and neck supple. No rigidity.  Lymphadenopathy:     Cervical: No cervical adenopathy.  Skin:    General: Skin is warm.  Neurological:     Mental Status: He is alert and oriented to person, place, and time.     Cranial Nerves:  No cranial nerve deficit, dysarthria or facial asymmetry.     Sensory: No sensory deficit.     Motor: No weakness.     Coordination: Coordination normal.     Gait: Gait normal.     Deep Tendon Reflexes: Reflexes normal.     UC Treatments / Results  Labs (all labs ordered are listed, but only abnormal results are displayed) Labs Reviewed - No data to display  EKG   Radiology No results found.  Procedures Procedures (including critical care time)  Medications Ordered in UC Medications - No data to display  Initial Impression / Assessment and Plan / UC Course  I have reviewed the triage vital signs and the nursing notes.  Pertinent labs & imaging results that were available during my care of the patient were reviewed by me and considered in my medical decision making (see chart for details).     New.  Chest x-ray is negative for abnormalities.  Treating him with albuterol and recommending smoking cessation along with a pulmonologist follow-up.  In terms of his back pain I have recommended that he complete ab strengthening exercises, weight loss, muscle relaxer, and OTC medications as needed.  Discussed red flag signs and symptoms. Final Clinical Impressions(s) / UC Diagnoses   Final diagnoses:  None   Discharge Instructions   None    ED Prescriptions   None    PDMP not reviewed this encounter.   Rushie Chestnut, [place] 10/28/20 1121

## 2020-10-28 NOTE — ED Triage Notes (Signed)
Pt is present today with recurrent back pain, HA, cough, and dizziness. Pt states that sometimes that his back locks upon him sporadically. Pt states that his other sx started over three weeks ago.

## 2020-12-07 ENCOUNTER — Institutional Professional Consult (permissible substitution): Payer: Self-pay | Admitting: Pulmonary Disease

## 2020-12-07 NOTE — Progress Notes (Deleted)
Synopsis: Referred in August 2022 for cough by Clent Jacks, PA  Subjective:   PATIENT ID: Harry Donovan GENDER: male DOB: 03-20-1996, MRN: [id number]   HPI  No chief complaint on file.   Harry Donovan is a 25 year old male, daily smoker with hypertension and acid reflux who is referred to pulmonary clinic for cough.   Past Medical History:  Diagnosis Date   Acid reflux    Hypertension      Family History  Problem Relation Age of Onset   Hypertension Mother    Hypertension Father    Diabetes Father      Social History   Socioeconomic History   Marital status: Single    Spouse name: Not on file   Number of children: Not on file   Years of education: Not on file   Highest education level: Not on file  Occupational History   Not on file  Tobacco Use   Smoking status: Some Days    Types: Cigars   Smokeless tobacco: Never  Vaping Use   Vaping Use: Never used  Substance and Sexual Activity   Alcohol use: Not Currently   Drug use: No   Sexual activity: Not on file  Other Topics Concern   Not on file  Social History Narrative   Football player at [employer]   Social Determinants of Health   Financial Resource Strain: Not on file  Food Insecurity: Not on file  Transportation Needs: Not on file  Physical Activity: Not on file  Stress: Not on file  Social Connections: Not on file  Intimate Partner Violence: Not on file     No Known Allergies   Outpatient Medications Prior to Visit  Medication Sig Dispense Refill   albuterol (VENTOLIN HFA) 108 (90 Base) MCG/ACT inhaler Inhale 1-2 puffs into the lungs every 6 (six) hours as needed for wheezing or shortness of breath. 1 each 0   famotidine (PEPCID) 20 MG tablet Take 1 tablet (20 mg total) by mouth 2 (two) times daily. 30 tablet 0   ibuprofen (ADVIL) 800 MG tablet Take 1 tablet (800 mg total) by mouth 3 (three) times daily. 21 tablet 0   methocarbamol (ROBAXIN) 500 MG tablet Take 1 tablet (500 mg  total) by mouth 2 (two) times daily. 20 tablet 0   No facility-administered medications prior to visit.    ROS    Objective:  There were no vitals filed for this visit.   Physical Exam    CBC    Component Value Date/Time   WBC 9.4 05/18/2020 1433   RBC 5.29 05/18/2020 1433   HGB 14.3 05/18/2020 1433   HCT 44.8 05/18/2020 1433   PLT 175 05/18/2020 1433   MCV 84.7 05/18/2020 1433   MCH 27.0 05/18/2020 1433   MCHC 31.9 05/18/2020 1433   RDW 13.1 05/18/2020 1433   LYMPHSABS 0.6 (L) 05/18/2020 1433   MONOABS 0.8 05/18/2020 1433   EOSABS 0.1 05/18/2020 1433   BASOSABS 0.0 05/18/2020 1433   BMP Latest Ref Rng & Units 05/18/2020 02/18/2019 09/29/2017  Glucose 70 - 99 mg/dL 277(O) 98 242(P)  BUN 6 - 20 mg/dL 15 12 53(I)  Creatinine 0.61 - 1.24 mg/dL 1.44 3.15 4.00  Sodium 135 - 145 mmol/L 138 140 138  Potassium 3.5 - 5.1 mmol/L 4.8 4.4 3.6  Chloride 98 - 111 mmol/L 105 109 106  CO2 22 - 32 mmol/L 22 21(L) 22  Calcium 8.9 - 10.3 mg/dL 9.7 9.6 9.0  Chest imaging: CXR 10/28/20 Normal heart, mediastinum and hila. Clear lungs. No pleural effusion or pneumothorax. Skeletal structures are within normal limits.  PFT: No flowsheet data found.  Echo 11/27/2010: LVEF 55-60%. RV size and function is normal.     Assessment & Plan:   Cough  Discussion: ***    Current Outpatient Medications:    albuterol (VENTOLIN HFA) 108 (90 Base) MCG/ACT inhaler, Inhale 1-2 puffs into the lungs every 6 (six) hours as needed for wheezing or shortness of breath., Disp: 1 each, Rfl: 0   famotidine (PEPCID) 20 MG tablet, Take 1 tablet (20 mg total) by mouth 2 (two) times daily., Disp: 30 tablet, Rfl: 0   ibuprofen (ADVIL) 800 MG tablet, Take 1 tablet (800 mg total) by mouth 3 (three) times daily., Disp: 21 tablet, Rfl: 0   methocarbamol (ROBAXIN) 500 MG tablet, Take 1 tablet (500 mg total) by mouth 2 (two) times daily., Disp: 20 tablet, Rfl: 0

## 2021-04-02 ENCOUNTER — Encounter (HOSPITAL_COMMUNITY): Payer: Self-pay

## 2021-04-02 ENCOUNTER — Ambulatory Visit (HOSPITAL_COMMUNITY)
Admission: EM | Admit: 2021-04-02 | Discharge: 2021-04-02 | Disposition: A | Discharge status: Home / Self Care | Payer: Self-pay | Attending: Internal Medicine | Admitting: Internal Medicine

## 2021-04-02 DIAGNOSIS — M5442 Lumbago with sciatica, left side: Secondary | ICD-10-CM

## 2021-04-02 MED ORDER — IBUPROFEN 800 MG PO TABS: 800.0000 mg | ORAL_TABLET | Freq: Three times a day (TID) | ORAL | 0 refills | Status: DC

## 2021-04-02 MED ORDER — METHOCARBAMOL 500 MG PO TABS: 500.0000 mg | ORAL_TABLET | Freq: Every evening | ORAL | 0 refills | Status: DC | PRN

## 2021-04-02 MED ORDER — KETOROLAC TROMETHAMINE 60 MG/2ML IM SOLN
60.0000 mg | Freq: Once | INTRAMUSCULAR | Status: AC
  Administered 2021-04-02: 16:00:00 60 mg via INTRAMUSCULAR

## 2021-04-02 MED ORDER — KETOROLAC TROMETHAMINE 60 MG/2ML IM SOLN
INTRAMUSCULAR | Status: AC
  Filled 2021-04-02: qty 2

## 2021-04-02 NOTE — ED Triage Notes (Signed)
Patient hurt his back playing basketball.

## 2021-04-02 NOTE — ED Provider Notes (Signed)
[facility]    CSN: [id number] Arrival date & time: 04/02/21  1258      History   Chief Complaint Chief Complaint  Patient presents with   Back Pain    Patient reports hurting his back playing basketball.     HPI Harry Donovan is a 25 y.o. male comes to the urgent care with severe back pain which started on Friday after playing basketball.  Patient has a history of sciatica.  During the basketball game patient felt a pull in his back.  After that patient experienced sharp/throbbing lower back pain.  Pain was aggravated by movement and stretching.  Patient denies any relieving factors.  Pain radiates into the left foot.  He has some numbness in the left foot.  No weakness in the lower extremities.  No urinary or bowel problems.  HPI  Past Medical History:  Diagnosis Date   Acid reflux    Hypertension     Patient Active Problem List   Diagnosis Date Noted   Hyperglycemia 01/10/2011   Chest pain 12/06/2010   Essential hypertension, benign 12/06/2010   Encounter for sports participation examination 12/06/2010    History reviewed. No pertinent surgical history.     Home Medications    Prior to Admission medications   Medication Sig Start Date End Date Taking? Authorizing Provider  ibuprofen (ADVIL) 800 MG tablet Take 1 tablet (800 mg total) by mouth 3 (three) times daily. 04/02/21  Yes Kaileena Obi, Britta Mccreedy, MD  methocarbamol (ROBAXIN) 500 MG tablet Take 1 tablet (500 mg total) by mouth at bedtime as needed for muscle spasms. 04/02/21  Yes Paulett Kaufhold, Britta Mccreedy, MD  albuterol (VENTOLIN HFA) 108 (90 Base) MCG/ACT inhaler Inhale 1-2 puffs into the lungs every 6 (six) hours as needed for wheezing or shortness of breath. 10/28/20   Rushie Chestnut, PA-C  famotidine (PEPCID) 20 MG tablet Take 1 tablet (20 mg total) by mouth 2 (two) times daily. 09/15/19   Eustace Moore, MD    Family History Family History  Problem Relation Age of Onset   Hypertension Mother     Hypertension Father    Diabetes Father     Social History Social History   Tobacco Use   Smoking status: Some Days    Types: Cigars   Smokeless tobacco: Never  Vaping Use   Vaping Use: Never used  Substance Use Topics   Alcohol use: Not Currently   Drug use: No     Allergies   Patient has no known allergies.   Review of Systems Review of Systems  HENT: Negative.    Respiratory: Negative.    Cardiovascular: Negative.   Gastrointestinal: Negative.   Musculoskeletal:  Positive for back pain. Negative for myalgias, neck pain and neck stiffness.  Neurological: Negative.     Physical Exam Triage Vital Signs ED Triage Vitals [04/02/21 1441]  Enc Vitals Group     BP 130/89     Pulse Rate 86     Resp 18     Temp 97.8 F (36.6 C)     Temp Source Oral     SpO2 100 %     Weight      Height      Head Circumference      Peak Flow      Pain Score 10     Pain Loc      Pain Edu?      Excl. in GC?    No data found.  Updated Vital Signs BP 130/89 (BP Location: Left Arm)   Pulse 86   Temp 97.8 F (36.6 C) (Oral)   Resp 18   SpO2 100%   Visual Acuity Right Eye Distance:   Left Eye Distance:   Bilateral Distance:    Right Eye Near:   Left Eye Near:    Bilateral Near:     Physical Exam Vitals and nursing note reviewed.  Constitutional:      General: He is not in acute distress.    Appearance: He is not ill-appearing.  Cardiovascular:     Rate and Rhythm: Normal rate and regular rhythm.  Pulmonary:     Effort: Pulmonary effort is normal.     Breath sounds: Normal breath sounds.  Musculoskeletal:        General: Tenderness present. No swelling.     Comments: Range of motion of the lumbar spine is limited by pain.  Deep tendon reflexes are 2+ in both knees.  Tenderness over the paraspinal muscles in the lumbar region.  Neurological:     Mental Status: He is alert.     UC Treatments / Results  Labs (all labs ordered are listed, but only abnormal  results are displayed) Labs Reviewed - No data to display  EKG   Radiology No results found.  Procedures Procedures (including critical care time)  Medications Ordered in UC Medications  ketorolac (TORADOL) injection 60 mg (60 mg Intramuscular Given 04/02/21 1532)    Initial Impression / Assessment and Plan / UC Course  I have reviewed the triage vital signs and the nursing notes.  Pertinent labs & imaging results that were available during my care of the patient were reviewed by me and considered in my medical decision making (see chart for details).     1.  Acute back pain with sciatica, left: Gentle range of motion exercises Toradol 60 mg IM x1 dose Icing of the lower back Ibuprofen every 8 hours as needed for pain Muscle relaxants prescribed-precautions given Return to urgent care if symptoms worsen. Final Clinical Impressions(s) / UC Diagnoses   Final diagnoses:  Acute back pain with sciatica, left     Discharge Instructions      Icing of the lower back Take medications as prescribed Gentle stretching exercises Do not operate a vehicle or heavy machinery after taking muscle relaxants. Return to urgent care if symptoms worsen.     ED Prescriptions     Medication Sig Dispense Auth. Provider   ibuprofen (ADVIL) 800 MG tablet Take 1 tablet (800 mg total) by mouth 3 (three) times daily. 30 tablet Canyon Lohr, Britta Mccreedy, MD   methocarbamol (ROBAXIN) 500 MG tablet Take 1 tablet (500 mg total) by mouth at bedtime as needed for muscle spasms. 20 tablet Raeghan Demeter, Britta Mccreedy, MD      PDMP not reviewed this encounter.   Merrilee Jansky, MD 04/02/21 [phone]

## 2021-04-02 NOTE — Discharge Instructions (Addendum)
Icing of the lower back Take medications as prescribed Gentle stretching exercises Do not operate a vehicle or heavy machinery after taking muscle relaxants. Return to urgent care if symptoms worsen.

## 2021-06-14 ENCOUNTER — Other Ambulatory Visit: Payer: Self-pay

## 2021-06-14 ENCOUNTER — Encounter (HOSPITAL_COMMUNITY): Payer: Self-pay | Admitting: *Deleted

## 2021-06-14 ENCOUNTER — Ambulatory Visit (HOSPITAL_COMMUNITY)
Admission: EM | Admit: 2021-06-14 | Discharge: 2021-06-14 | Disposition: A | Discharge status: Home / Self Care | Payer: Self-pay | Attending: Emergency Medicine | Admitting: Emergency Medicine

## 2021-06-14 DIAGNOSIS — Z20822 Contact with and (suspected) exposure to covid-19: Secondary | ICD-10-CM | POA: Insufficient documentation

## 2021-06-14 DIAGNOSIS — J020 Streptococcal pharyngitis: Secondary | ICD-10-CM | POA: Insufficient documentation

## 2021-06-14 LAB — POCT RAPID STREP A, ED / UC: Streptococcus, Group A Screen (Direct): POSITIVE — AB

## 2021-06-14 LAB — SARS CORONAVIRUS 2 (TAT 6-24 HRS): SARS Coronavirus 2: NEGATIVE

## 2021-06-14 MED ORDER — PENICILLIN G BENZATHINE 1200000 UNIT/2ML IM SUSY
1.2000 10*6.[IU] | PREFILLED_SYRINGE | Freq: Once | INTRAMUSCULAR | Status: AC
  Administered 2021-06-14: 1.2 10*6.[IU] via INTRAMUSCULAR

## 2021-06-14 MED ORDER — LIDOCAINE VISCOUS HCL 2 % MT SOLN: 15.0000 mL | OROMUCOSAL | 0 refills | Status: DC | PRN

## 2021-06-14 MED ORDER — PENICILLIN G BENZATHINE 1200000 UNIT/2ML IM SUSY
PREFILLED_SYRINGE | INTRAMUSCULAR | Status: AC
  Filled 2021-06-14: qty 2

## 2021-06-14 NOTE — ED Provider Notes (Signed)
[facility]    CSN: [id number] Arrival date & time: 06/14/21  3734      History   Chief Complaint Chief Complaint  Patient presents with   hard time swalling   Sore Throat    HPI Harry Donovan is a 26 y.o. male.  Patient reports severe sore throat with swollen tonsils for the last 4 days.  Reports mild nasal congestion and postnasal drainage but sore throat is biggest concern.  Denies fever or chills.  Reports he also feels tired.  Denies other symptoms.  Is here because his employer wants him to have a COVID test before returning to work.   Sore Throat   Past Medical History:  Diagnosis Date   Acid reflux    Hypertension     Patient Active Problem List   Diagnosis Date Noted   Hyperglycemia 01/10/2011   Chest pain 12/06/2010   Essential hypertension, benign 12/06/2010   Encounter for sports participation examination 12/06/2010    History reviewed. No pertinent surgical history.     Home Medications    Prior to Admission medications   Medication Sig Start Date End Date Taking? Authorizing Provider  lidocaine (XYLOCAINE) 2 % solution Use as directed 15 mLs in the mouth or throat as needed for mouth pain. 06/14/21  Yes Cathlyn Parsons, NP  albuterol (VENTOLIN HFA) 108 (90 Base) MCG/ACT inhaler Inhale 1-2 puffs into the lungs every 6 (six) hours as needed for wheezing or shortness of breath. 10/28/20   Rushie Chestnut, PA-C  famotidine (PEPCID) 20 MG tablet Take 1 tablet (20 mg total) by mouth 2 (two) times daily. 09/15/19   Eustace Moore, MD  ibuprofen (ADVIL) 800 MG tablet Take 1 tablet (800 mg total) by mouth 3 (three) times daily. 04/02/21   Lamptey, Britta Mccreedy, MD  methocarbamol (ROBAXIN) 500 MG tablet Take 1 tablet (500 mg total) by mouth at bedtime as needed for muscle spasms. 04/02/21   Lamptey, Britta Mccreedy, MD    Family History Family History  Problem Relation Age of Onset   Hypertension Mother    Hypertension Father    Diabetes Father      Social History Social History   Tobacco Use   Smoking status: Some Days    Types: Cigars   Smokeless tobacco: Never  Vaping Use   Vaping Use: Never used  Substance Use Topics   Alcohol use: Not Currently   Drug use: No     Allergies   Patient has no known allergies.   Review of Systems Review of Systems  Constitutional:  Negative for chills and fever.  HENT:  Positive for congestion, postnasal drip, sore throat and trouble swallowing. Negative for sinus pressure.   Respiratory:  Positive for cough.     Physical Exam Triage Vital Signs ED Triage Vitals  Enc Vitals Group     BP 06/14/21 0834 (!) 150/91     Pulse Rate 06/14/21 0834 78     Resp 06/14/21 0834 18     Temp 06/14/21 0834 98.4 F (36.9 C)     Temp src --      SpO2 06/14/21 0834 93 %     Weight --      Height --      Head Circumference --      Peak Flow --      Pain Score 06/14/21 0831 8     Pain Loc --      Pain Edu? --  Excl. in GC? --    No data found.  Updated Vital Signs BP (!) 150/91    Pulse 78    Temp 98.4 F (36.9 C)    Resp 18    SpO2 93%   Visual Acuity Right Eye Distance:   Left Eye Distance:   Bilateral Distance:    Right Eye Near:   Left Eye Near:    Bilateral Near:     Physical Exam Constitutional:      General: He is not in acute distress.    Appearance: He is well-developed. He is not ill-appearing.  HENT:     Head: Normocephalic and atraumatic.     Nose: No congestion.     Mouth/Throat:     Mouth: Mucous membranes are moist.     Pharynx: Oropharyngeal exudate and posterior oropharyngeal erythema present.     Tonsils: 4+ on the right. 4+ on the left.  Cardiovascular:     Rate and Rhythm: Normal rate and regular rhythm.  Pulmonary:     Effort: Pulmonary effort is normal.     Breath sounds: Normal breath sounds.  Lymphadenopathy:     Head:     Right side of head: Submandibular adenopathy present.     Left side of head: Submandibular adenopathy present.   Neurological:     Mental Status: He is alert.     UC Treatments / Results  Labs (all labs ordered are listed, but only abnormal results are displayed) Labs Reviewed  POCT RAPID STREP A, ED / UC - Abnormal; Notable for the following components:      Result Value   Streptococcus, Group A Screen (Direct) POSITIVE (*)    All other components within normal limits  SARS CORONAVIRUS 2 (TAT 6-24 HRS)    EKG   Radiology No results found.  Procedures Procedures (including critical care time)  Medications Ordered in UC Medications  penicillin g benzathine (BICILLIN LA) 1200000 UNIT/2ML injection 1.2 Million Units (1.2 Million Units Intramuscular Given 06/14/21 0929)    Initial Impression / Assessment and Plan / UC Course  I have reviewed the triage vital signs and the nursing notes.  Pertinent labs & imaging results that were available during my care of the patient were reviewed by me and considered in my medical decision making (see chart for details).     Offered patient GC and Chlamydia testing for throat and patient declined.  Rapid strep is positive.  Patient does not think he can swallow pills so given 1,200,000 units of Bicillin IM here in urgent care.  Patient is having difficulty swallowing due to pain so prescribed viscous lidocaine.  Given note for work.  Final Clinical Impressions(s) / UC Diagnoses   Final diagnoses:  Strep throat     Discharge Instructions      You will get a call if covid test is positive, you will not get a call if tests are negative but you can check results in MyChart if you have a MyChart account.    It is okay if you do not feel like eating right now, but is important to drink plenty fluids and stay hydrated.  As the penicillin shot starts to heal the infection, your throat will feel better and you will be able to eat again.  You can use the viscous lidocaine prescription to help with your throat pain.  You can also try children's liquid  ibuprofen (the dose would be 4 teaspoons of liquid children's ibuprofen every 4-6 hours if  you want to try it).     ED Prescriptions     Medication Sig Dispense Auth. Provider   lidocaine (XYLOCAINE) 2 % solution Use as directed 15 mLs in the mouth or throat as needed for mouth pain. 100 mL Cathlyn Parsons, NP      PDMP not reviewed this encounter.   Cathlyn Parsons, NP 06/14/21 [phone]

## 2021-06-14 NOTE — Discharge Instructions (Addendum)
You will get a call if covid test is positive, you will not get a call if tests are negative but you can check results in MyChart if you have a MyChart account.    It is okay if you do not feel like eating right now, but is important to drink plenty fluids and stay hydrated.  As the penicillin shot starts to heal the infection, your throat will feel better and you will be able to eat again.  You can use the viscous lidocaine prescription to help with your throat pain.  You can also try children's liquid ibuprofen (the dose would be 4 teaspoons of liquid children's ibuprofen every 4-6 hours if you want to try it).

## 2021-06-14 NOTE — ED Triage Notes (Signed)
Pt reports he is having a hard time swallowing and he thinks his tonsils are swollen. Pt 's employer is requesting him to get a COVIDtest.

## 2021-10-04 ENCOUNTER — Emergency Department (HOSPITAL_COMMUNITY): Payer: Self-pay

## 2021-10-04 ENCOUNTER — Other Ambulatory Visit: Payer: Self-pay

## 2021-10-04 ENCOUNTER — Emergency Department (HOSPITAL_COMMUNITY)
Admission: EM | Admit: 2021-10-04 | Discharge: 2021-10-04 | Disposition: A | Discharge status: Home / Self Care | Payer: Self-pay | Attending: Emergency Medicine | Admitting: Emergency Medicine

## 2021-10-04 DIAGNOSIS — I10 Essential (primary) hypertension: Secondary | ICD-10-CM | POA: Insufficient documentation

## 2021-10-04 DIAGNOSIS — F17200 Nicotine dependence, unspecified, uncomplicated: Secondary | ICD-10-CM | POA: Insufficient documentation

## 2021-10-04 DIAGNOSIS — R001 Bradycardia, unspecified: Secondary | ICD-10-CM | POA: Insufficient documentation

## 2021-10-04 DIAGNOSIS — Z720 Tobacco use: Secondary | ICD-10-CM

## 2021-10-04 DIAGNOSIS — R059 Cough, unspecified: Secondary | ICD-10-CM | POA: Insufficient documentation

## 2021-10-04 DIAGNOSIS — K219 Gastro-esophageal reflux disease without esophagitis: Secondary | ICD-10-CM | POA: Insufficient documentation

## 2021-10-04 MED ORDER — CETIRIZINE HCL 10 MG PO TABS: 10.0000 mg | ORAL_TABLET | Freq: Every day | ORAL | 0 refills | Status: DC | PRN

## 2021-10-04 MED ORDER — TETANUS-DIPHTH-ACELL PERTUSSIS 5-2.5-18.5 LF-MCG/0.5 IM SUSY
0.5000 mL | PREFILLED_SYRINGE | Freq: Once | INTRAMUSCULAR | Status: DC
  Filled 2021-10-04: qty 0.5

## 2021-10-04 MED ORDER — PANTOPRAZOLE SODIUM 40 MG PO TBEC: 40.0000 mg | DELAYED_RELEASE_TABLET | Freq: Every day | ORAL | 0 refills | Status: DC

## 2021-10-04 NOTE — ED Provider Notes (Signed)
[facility] EMERGENCY DEPARTMENT Provider Note   CSN: [id number] Arrival date & time: 10/04/21  [id number]     History  Chief Complaint  Patient presents with   Cough    Harry Donovan is a 26 y.o. male with a history of hypertension, acid reflux, and tobacco abuse who presents to the emergency department with multiple concerns.  He states he is having problems with mucus and acid reflux.  He states that he often gets mucus stuck in his throat and he has to cough it up and also often regurgitates his food/has acid reflux.  Sometimes he gets winded.  He states he notices his symptoms are worse when he utilizes tobacco products, has been vaping a lot recently.  No other alleviating or aggravating factors.  He denies fever, chills, abdominal pain, N/V, chest pain, leg pain/swelling, hemoptysis, recent surgery/trauma, recent long travel, hormone use, personal hx of cancer, or hx of DVT/PE.   Patient also mentioned cut to his right 3rd finger from 2 weeks prior that he wanted to mention, unknown last tetanus.     HPI     Home Medications Prior to Admission medications   Medication Sig Start Date End Date Taking? Authorizing Provider  albuterol (VENTOLIN HFA) 108 (90 Base) MCG/ACT inhaler Inhale 1-2 puffs into the lungs every 6 (six) hours as needed for wheezing or shortness of breath. 10/28/20   Hughie Closs, PA-C  famotidine (PEPCID) 20 MG tablet Take 1 tablet (20 mg total) by mouth 2 (two) times daily. 09/15/19   Raylene Everts, MD  ibuprofen (ADVIL) 800 MG tablet Take 1 tablet (800 mg total) by mouth 3 (three) times daily. 04/02/21   Lamptey, Myrene Galas, MD  lidocaine (XYLOCAINE) 2 % solution Use as directed 15 mLs in the mouth or throat as needed for mouth pain. 06/14/21   Carvel Getting, NP  methocarbamol (ROBAXIN) 500 MG tablet Take 1 tablet (500 mg total) by mouth at bedtime as needed for muscle spasms. 04/02/21   Lamptey, Myrene Galas, MD      Allergies    Patient  has no known allergies.    Review of Systems   Review of Systems  Constitutional:  Negative for chills and fever.  Respiratory:  Positive for cough and shortness of breath.   Cardiovascular:  Negative for chest pain.  Gastrointestinal:  Negative for abdominal pain, blood in stool, nausea and vomiting.       Positive for acid reflux problems.  Neurological:  Negative for syncope.  All other systems reviewed and are negative.  Physical Exam Updated Vital Signs BP (!) 144/87 (BP Location: Left Arm)   Pulse 71   Temp 97.9 F (36.6 C)   Resp 17   SpO2 92%  Physical Exam Vitals and nursing note reviewed.  Constitutional:      General: He is not in acute distress.    Appearance: He is well-developed. He is not toxic-appearing.  HENT:     Head: Normocephalic and atraumatic.     Mouth/Throat:     Comments: Posterior oropharynx is symmetric appearing. Patient tolerating own secretions without difficulty. No trismus. No drooling. No hot potato voice. No swelling beneath the tongue, submandibular compartment is soft.  Eyes:     General:        Right eye: No discharge.        Left eye: No discharge.     Conjunctiva/sclera: Conjunctivae normal.  Cardiovascular:     Rate and Rhythm:  Regular rhythm. Bradycardia present.  Pulmonary:     Effort: No respiratory distress.     Breath sounds: Normal breath sounds. No wheezing or rales.  Abdominal:     General: There is no distension.     Palpations: Abdomen is soft.     Tenderness: There is no abdominal tenderness. There is no guarding or rebound.  Musculoskeletal:     Cervical back: Neck supple.     Comments: Upper extremities: old appearing linear scabbed wound to the dorsal right 3rd finger. No active bleeding. No erythema/drainage. Intact AROM. Able to flex/extend against resistance, nontender. < 2 second cap refill.   Skin:    General: Skin is warm and dry.  Neurological:     Mental Status: He is alert.     Comments: Clear speech.    Psychiatric:        Behavior: Behavior normal.    ED Results / Procedures / Treatments   Labs (all labs ordered are listed, but only abnormal results are displayed) Labs Reviewed - No data to display  EKG EKG Interpretation  Date/Time:  Wednesday Oct 04 2021 05:28:48 EDT Ventricular Rate:  59 PR Interval:  166 QRS Duration: 96 QT Interval:  406 QTC Calculation: 401 R Axis:   100 Text Interpretation: Sinus bradycardia with Premature atrial complexes in a pattern of bigeminy Rightward axis T wave abnormality, consider inferior ischemia Abnormal ECG When compared with ECG of 18-May-2020 12:39, PREVIOUS ECG IS PRESENT Confirmed by Addison Lank [phone]) on 10/04/2021 5:41:55 AM  Radiology DG Chest 2 View  Result Date: 10/04/2021 CLINICAL DATA:  26 year old male with history of dyspnea and chest pain. EXAM: CHEST - 2 VIEW COMPARISON:  Chest x-ray 10/28/2020. FINDINGS: Lung volumes are normal. No consolidative airspace disease. No pleural effusions. No pneumothorax. No pulmonary nodule or mass noted. Pulmonary vasculature and the cardiomediastinal silhouette are within normal limits. IMPRESSION: No radiographic evidence of acute cardiopulmonary disease. Electronically Signed   By: Vinnie Langton M.D.   On: 10/04/2021 05:02    Procedures Procedures    Medications Ordered in ED Medications  Tdap (BOOSTRIX) injection 0.5 mL (0.5 mLs Intramuscular Patient Refused/Not Given 10/04/21 [id number])    ED Course/ Medical Decision Making/ A&P                           Medical Decision Making Amount and/or Complexity of Data Reviewed Radiology: ordered.  Risk OTC drugs. Prescription drug management.   Patient presents to the ED with complaints of cough, dyspnea, and acid reflux.  Nontoxic, BP elevated- doubt HTN emergency. Mild bradycardia. SpO2 92% on arrival- checked by me 100% on RA without increased wob.   Chart/nursing note reviewed for additional hx. Prior visits for somewhat  similar.   I ordered, viewed & interpreted CXR, agree with radiologist:  No radiographic evidence of acute cardiopulmonary disease  EKG: sinus bradycardia w/ PACs  CXR w/o pneumonia, fluid overload or pneumothorax. EKG without acute ischemia. Low risk wells- low suspicion for PE. Abdomen nontender without peritoneal signs doubt acute surgical abdominal process. Sxs likely multifactorial. Counseled on tobacco cessation. Will start PPI and trial zyrtec. Also mentioned right 3rd finger injury- good ROM, no focal tenderness, NVI distally, and no signs of infection, tetanus updated. PCP follow up. I discussed results, treatment plan, need for follow-up, and return precautions with the patient. Provided opportunity for questions, patient confirmed understanding and is in agreement with plan.    Final Clinical Impression(s) /  ED Diagnoses Final diagnoses:  Cough, unspecified type  Tobacco use  Mild acid reflux    Rx / DC Orders ED Discharge Orders          Ordered    pantoprazole (PROTONIX) 40 MG tablet  Daily        10/04/21 0622    cetirizine (ZYRTEC ALLERGY) 10 MG tablet  Daily PRN        10/04/21 0622              Amaryllis Dyke, PA-C 10/04/21 0645    Fatima Blank, MD 10/04/21 [phone]

## 2021-10-04 NOTE — Discharge Instructions (Addendum)
You were seen in the emergency department tonight for reflux and mucous production problems.   Please do not smoke/vape as this is likely worsening your symptoms.   Please take zyrtec daily and protonix daily (prior to meals) to help with congestion/post nasal drip and reflux.   We have prescribed you new medication(s) today. Discuss the medications prescribed today with your pharmacist as they can have adverse effects and interactions with your other medicines including over the counter and prescribed medications. Seek medical evaluation if you start to experience new or abnormal symptoms after taking one of these medicines, seek care immediately if you start to experience difficulty breathing, feeling of your throat closing, facial swelling, or rash as these could be indications of a more serious allergic reaction   Follow attached diet guidelines.   Your blood pressure was elevated in the ER please have this rechecked by primary care with a recheck of your symptoms in 1 week. Return to the ED for new or worsening symptoms or any other concerns.

## 2021-10-04 NOTE — ED Notes (Signed)
Patient transported to X-ray 

## 2021-10-04 NOTE — ED Triage Notes (Signed)
Pt reports hx of GERD but has not been taking his medication so would like to know what he can take to help it. Also reports productive cough, mucous stuck in his neck, wants to get his lungs checked out, and a two-week old laceration on middle finger.

## 2021-12-07 ENCOUNTER — Encounter (HOSPITAL_COMMUNITY): Payer: Self-pay | Admitting: Emergency Medicine

## 2021-12-07 ENCOUNTER — Ambulatory Visit (HOSPITAL_COMMUNITY)
Admission: EM | Admit: 2021-12-07 | Discharge: 2021-12-07 | Disposition: A | Discharge status: Home / Self Care | Payer: Self-pay | Attending: Nurse Practitioner | Admitting: Nurse Practitioner

## 2021-12-07 DIAGNOSIS — K219 Gastro-esophageal reflux disease without esophagitis: Secondary | ICD-10-CM

## 2021-12-07 DIAGNOSIS — R29818 Other symptoms and signs involving the nervous system: Secondary | ICD-10-CM

## 2021-12-07 DIAGNOSIS — R0602 Shortness of breath: Secondary | ICD-10-CM

## 2021-12-07 MED ORDER — AMOXICILLIN-POT CLAVULANATE 875-125 MG PO TABS: 1.0000 | ORAL_TABLET | Freq: Two times a day (BID) | ORAL | 0 refills | Status: DC

## 2021-12-07 MED ORDER — ALBUTEROL SULFATE HFA 108 (90 BASE) MCG/ACT IN AERS: 1.0000 | INHALATION_SPRAY | Freq: Four times a day (QID) | RESPIRATORY_TRACT | 0 refills | Status: AC | PRN

## 2021-12-07 NOTE — Discharge Instructions (Addendum)
Augmentin 500 mg Twice a day for 7 days  Coricidin HBP as directed on label  Albuterol inhaler 1-2 puffs as need for shortness of breath Omeprazole 20 mg daily for Acid reflux 30 minutes before morning meal may take twice daily on an empty stomach Vicks for nasal and chest congestion over the counter Get PCP for repeat sleep study for previous sleep apnea.

## 2021-12-07 NOTE — ED Triage Notes (Signed)
Pt reports has difficulty breathing for about week with exertion and or when driving will constantly pick in nose to clear out mucous.Reports that has to clear mucous out of nose to breath better. Reports that been coughing up a lot of phlegm. Pt reports will wake up during night trying to catch his breath.  Pt reports right heel pain for week or two.  Pt reports that he has stopped smoking nicotine but will still smoke vapes.

## 2021-12-07 NOTE — ED Provider Notes (Signed)
[facility]    CSN: [id number] Arrival date & time: 12/07/21  1117      History   Chief Complaint Chief Complaint  Patient presents with   Cough   Shortness of Breath   Foot Pain    HPI Harry Donovan is a 26 y.o. male.   HPI  The patient complains of dyspnea. Onset of symptoms was gradual starting a few days ago. Symptom course has been worsening, while severity at onset was none. Symptoms tend to occur while at rest. Symptoms are aggravated by nothing, alleviated by clearing mucous and have been associated with sinus congestin. Past respiratory history includes sleep apnea diagnoses. He has not treated this. Patient denies travel or residence in the [facility] [place]. Patient denies travel or residence in [place] country for TB. Patient denies occupational or hobby exposure to irritants such as sulfiting agents, tartrazine, epoxies, chemical, flour, wood dust.) Risks for MI include male and smoking. Risks for PE include none. Care prior to arrival consisted of nothing. He is also complaining acid reflux. He has not treated this. He does report a purposeful significant weight loss Past Medical History:  Diagnosis Date   Acid reflux    Hypertension     Patient Active Problem List   Diagnosis Date Noted   Hyperglycemia 01/10/2011   Chest pain 12/06/2010   Essential hypertension, benign 12/06/2010   Encounter for sports participation examination 12/06/2010    History reviewed. No pertinent surgical history.     Home Medications    Prior to Admission medications   Medication Sig Start Date End Date Taking? Authorizing Provider  albuterol (VENTOLIN HFA) 108 (90 Base) MCG/ACT inhaler Inhale 1-2 puffs into the lungs every 6 (six) hours as needed for wheezing or shortness of breath. 12/07/21  Yes Barbette Merino, NP  amoxicillin-clavulanate (AUGMENTIN) 875-125 MG tablet Take 1 tablet by mouth every 12 (twelve) hours. 12/07/21  Yes Barbette Merino, NP  ibuprofen (ADVIL)  800 MG tablet Take 1 tablet (800 mg total) by mouth 3 (three) times daily. 04/02/21   Lamptey, Britta Mccreedy, MD    Family History Family History  Problem Relation Age of Onset   Hypertension Mother    Hypertension Father    Diabetes Father     Social History Social History   Tobacco Use   Smoking status: Some Days    Types: Cigars   Smokeless tobacco: Never  Vaping Use   Vaping Use: Never used  Substance Use Topics   Alcohol use: Not Currently   Drug use: No     Allergies   Patient has no known allergies.   Review of Systems Review of Systems   Physical Exam Triage Vital Signs ED Triage Vitals [12/07/21 1158]  Enc Vitals Group     BP (!) 145/86     Pulse Rate 65     Resp 18     Temp 98.2 F (36.8 C)     Temp Source Oral     SpO2 97 %     Weight      Height      Head Circumference      Peak Flow      Pain Score      Pain Loc      Pain Edu?      Excl. in GC?    No data found.  Updated Vital Signs BP (!) 145/86 (BP Location: Right Arm)   Pulse 65   Temp 98.2 F (36.8 C) (  Oral)   Resp 18   SpO2 97%   Visual Acuity Right Eye Distance:   Left Eye Distance:   Bilateral Distance:    Right Eye Near:   Left Eye Near:    Bilateral Near:     Physical Exam Constitutional:      General: He is not in acute distress.    Appearance: He is obese. He is not ill-appearing, toxic-appearing or diaphoretic.  HENT:     Head: Normocephalic and atraumatic.     Mouth/Throat:     Mouth: Mucous membranes are moist.  Eyes:     Pupils: Pupils are equal, round, and reactive to light.  Cardiovascular:     Rate and Rhythm: Normal rate and regular rhythm.  Pulmonary:     Effort: Pulmonary effort is normal.     Breath sounds: Normal breath sounds. No decreased breath sounds, wheezing, rhonchi or rales.  Musculoskeletal:     Cervical back: Normal range of motion.  Skin:    General: Skin is warm and dry.     Capillary Refill: Capillary refill takes less than 2  seconds.  Neurological:     General: No focal deficit present.     Mental Status: He is alert and oriented to person, place, and time.  Psychiatric:        Mood and Affect: Mood normal.        Behavior: Behavior normal.      UC Treatments / Results  Labs (all labs ordered are listed, but only abnormal results are displayed) Labs Reviewed - No data to display  EKG   Radiology No results found.  Procedures Procedures (including critical care time)  Medications Ordered in UC Medications - No data to display  Initial Impression / Assessment and Plan / UC Course  I have reviewed the triage vital signs and the nursing notes.  Pertinent labs & imaging results that were available during my care of the patient were reviewed by me and considered in my medical decision making (see chart for details).     Shortness of breath URI Suspected Sleep Apnea GERD Final Clinical Impressions(s) / UC Diagnoses   Final diagnoses:  Suspected sleep apnea  Gastroesophageal reflux disease without esophagitis  SOB (shortness of breath)     Discharge Instructions      Augmentin 500 mg Twice a day for 7 days  Coricidin HBP as directed on label  Albuterol inhaler 1-2 puffs as need for shortness of breath Omeprazole 20 mg daily for Acid reflux 30 minutes before morning meal may take twice daily on an empty stomach Vicks for nasal and chest congestion over the counter Get PCP for repeat sleep study for previous sleep apnea.       ED Prescriptions     Medication Sig Dispense Auth. Provider   albuterol (VENTOLIN HFA) 108 (90 Base) MCG/ACT inhaler Inhale 1-2 puffs into the lungs every 6 (six) hours as needed for wheezing or shortness of breath. 8 g Barbette Merino, NP   amoxicillin-clavulanate (AUGMENTIN) 875-125 MG tablet Take 1 tablet by mouth every 12 (twelve) hours. 14 tablet Barbette Merino, NP      PDMP not reviewed this encounter.   Thad Ranger [place], [place] 12/11/21 [phone]

## 2023-01-12 IMAGING — DX DG CHEST 2V
3 series · 3 of 3 positions shown · non-contrast
Comparison: 05/18/2020

CLINICAL DATA: Hemoptysis.

EXAM:
CHEST - 2 VIEW

[chest pa]
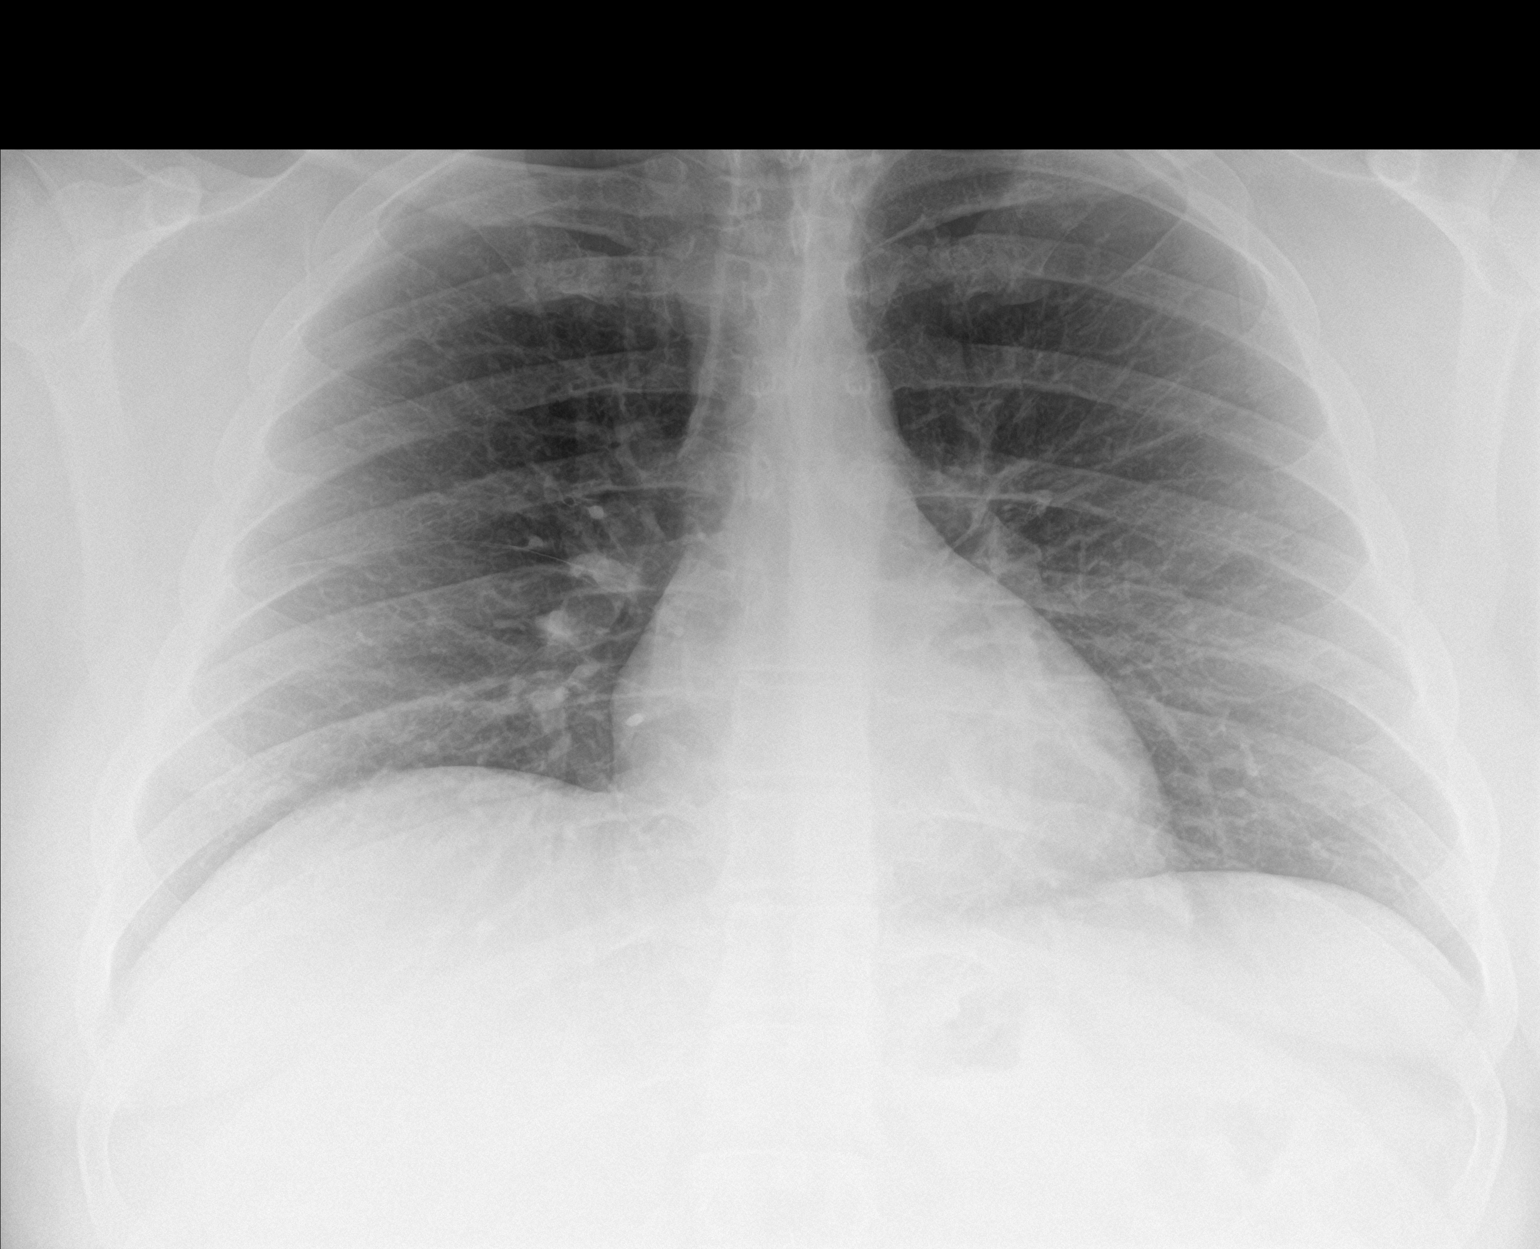

[chest lat (1 of 2)]
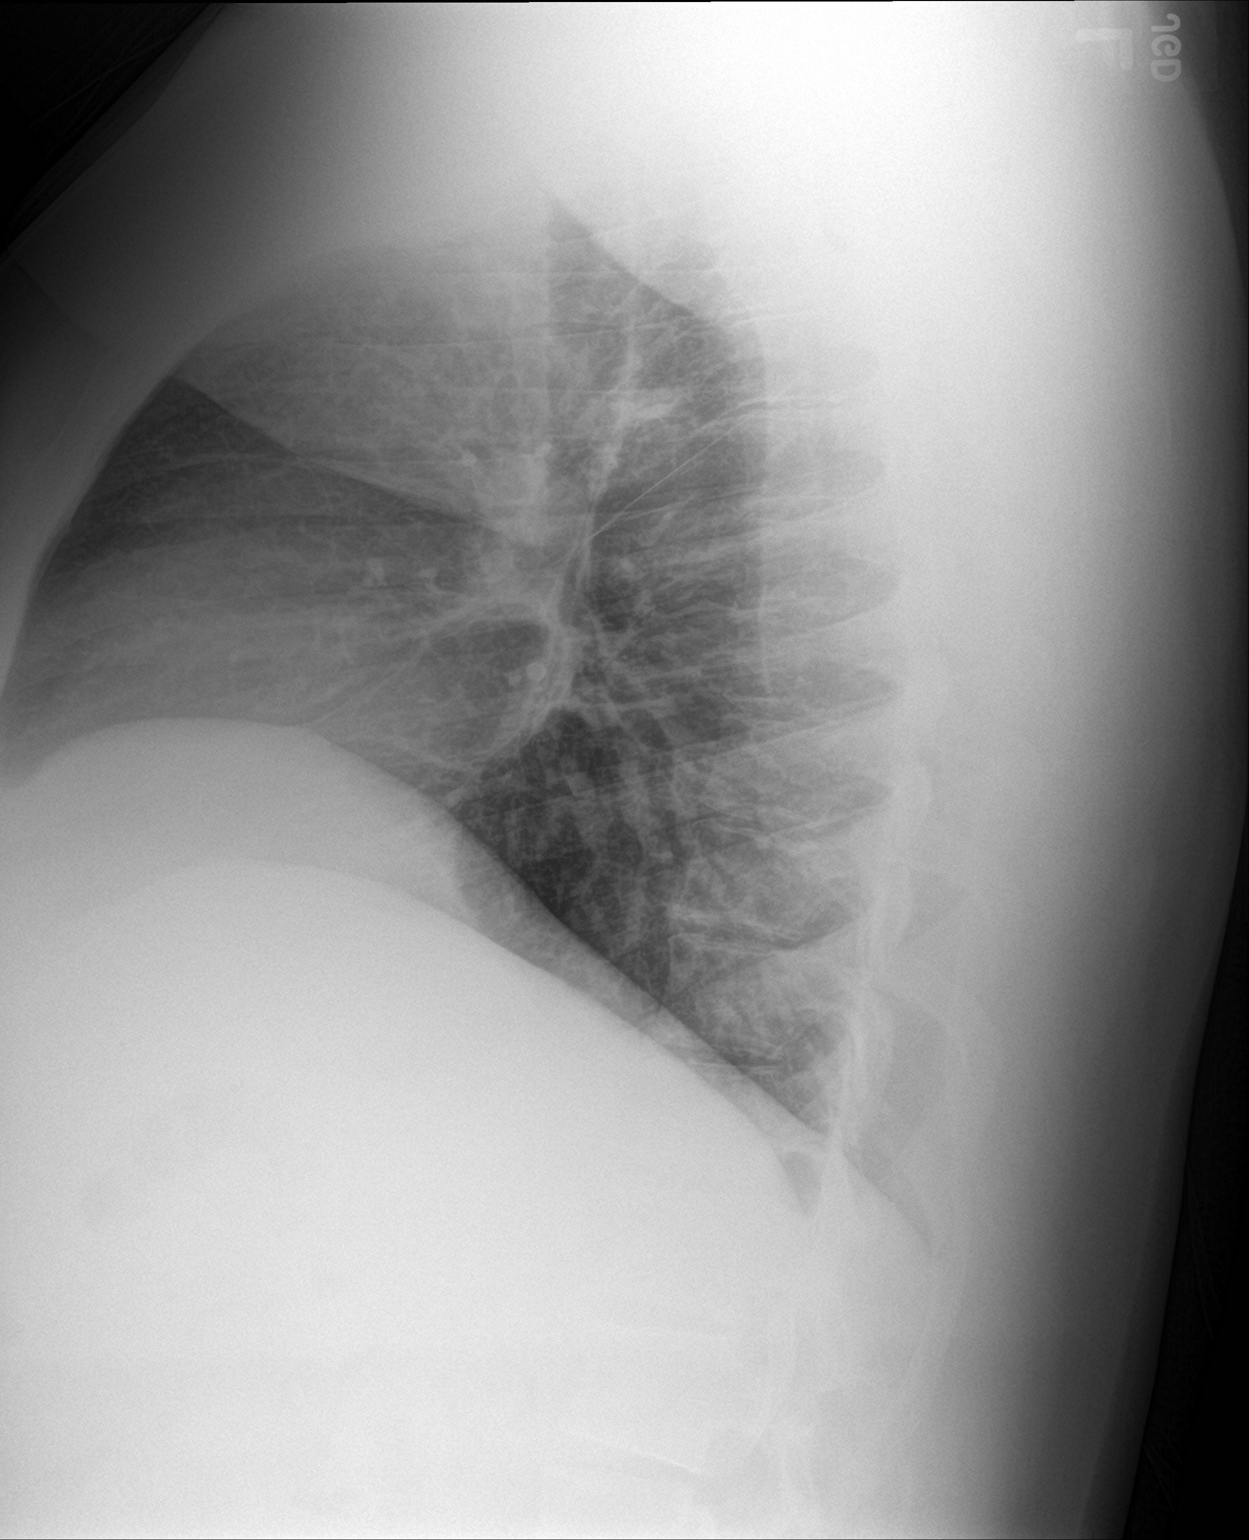

[chest lat (2 of 2)]
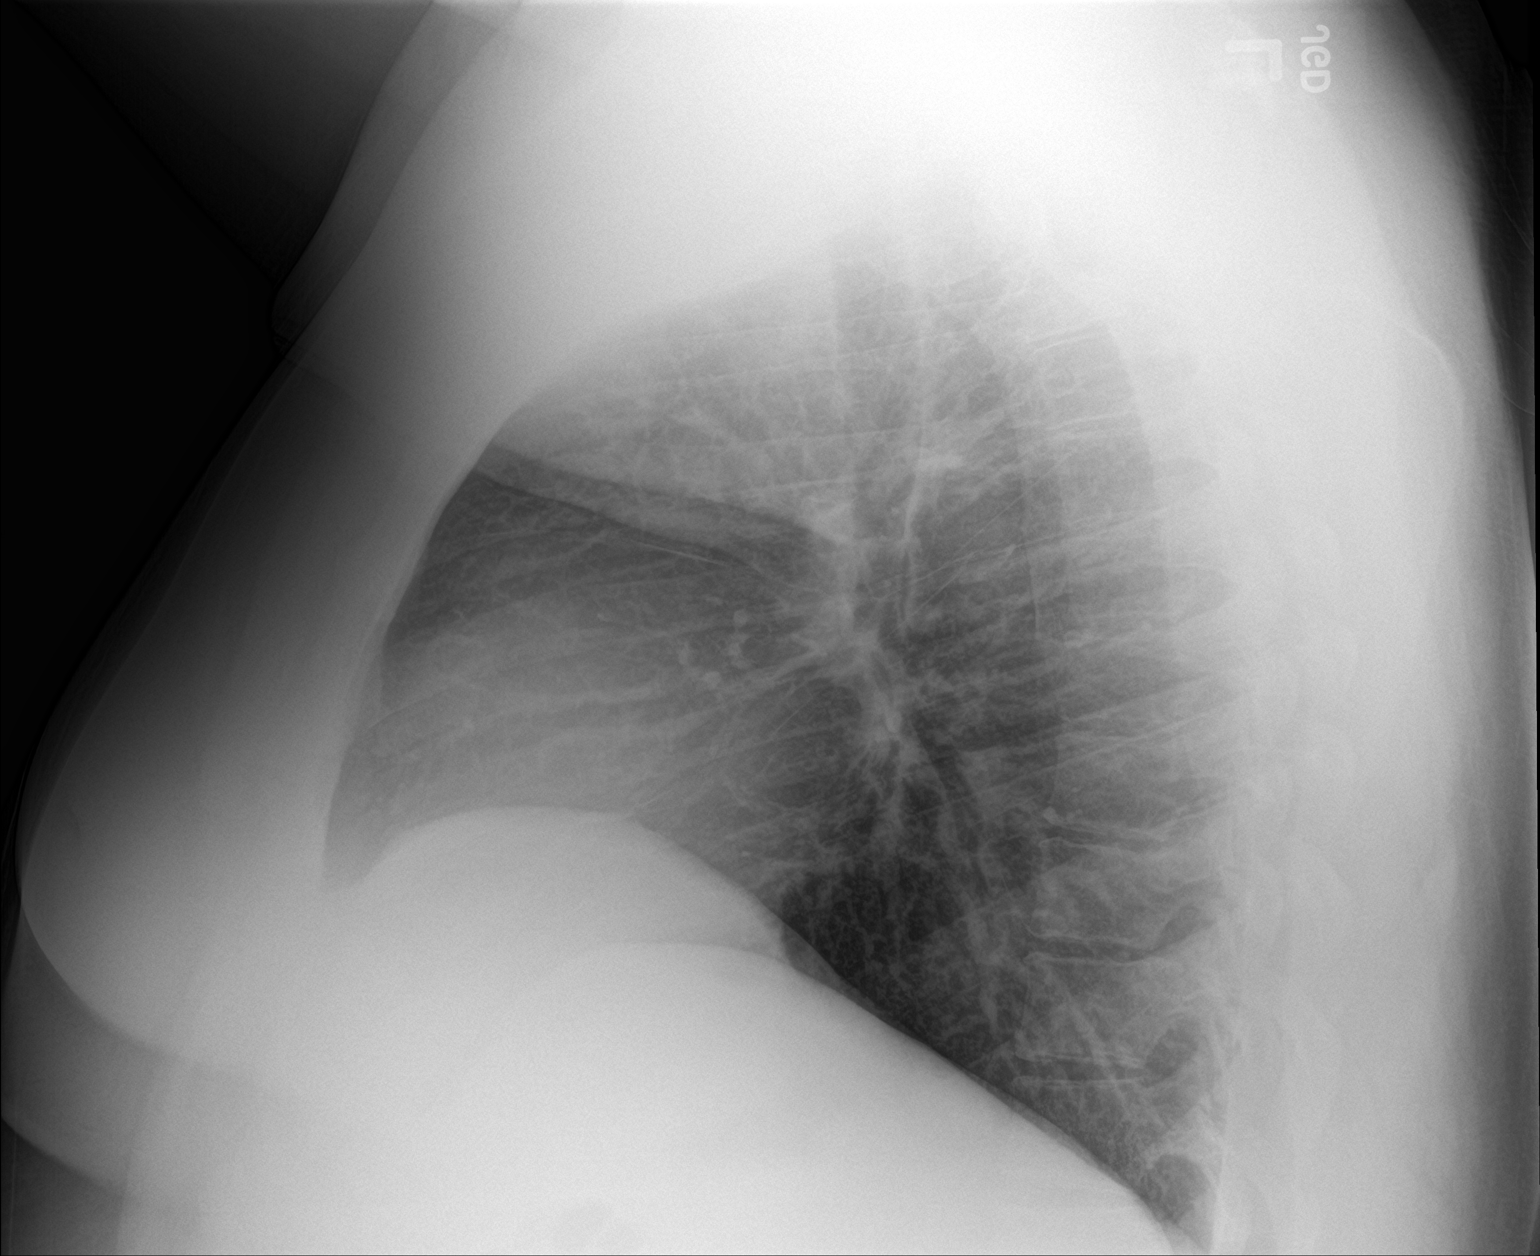

[3 of 3 positions shown; findings below may reference images not displayed]

FINDINGS: Normal heart, mediastinum and hila.

Clear lungs.

No pleural effusion or pneumothorax.

Skeletal structures are within normal limits.
IMPRESSION: Normal chest radiographs.

## 2023-01-19 ENCOUNTER — Ambulatory Visit (HOSPITAL_COMMUNITY): Admission: EM | Admit: 2023-01-19 | Discharge: 2023-01-19 | Discharge status: Against Medical Advice | Payer: Self-pay

## 2023-01-19 NOTE — ED Notes (Signed)
Patient LWBS before triage. States he was unable to wait.

## 2023-05-31 ENCOUNTER — Encounter (HOSPITAL_COMMUNITY): Payer: Self-pay

## 2023-05-31 ENCOUNTER — Ambulatory Visit (HOSPITAL_COMMUNITY)
Admission: EM | Admit: 2023-05-31 | Discharge: 2023-05-31 | Disposition: A | Discharge status: Home / Self Care | Payer: Self-pay | Attending: Family Medicine | Admitting: Family Medicine

## 2023-05-31 VITALS — BP 161/73 | HR 71 | Temp 97.7°F | Resp 18

## 2023-05-31 DIAGNOSIS — L309 Dermatitis, unspecified: Secondary | ICD-10-CM

## 2023-05-31 DIAGNOSIS — J0100 Acute maxillary sinusitis, unspecified: Secondary | ICD-10-CM

## 2023-05-31 MED ORDER — TRIAMCINOLONE ACETONIDE 0.5 % EX OINT: 1.0000 | TOPICAL_OINTMENT | Freq: Two times a day (BID) | CUTANEOUS | 0 refills | Status: AC

## 2023-05-31 MED ORDER — AMOXICILLIN-POT CLAVULANATE 875-125 MG PO TABS
1.0000 | ORAL_TABLET | Freq: Two times a day (BID) | ORAL | 0 refills | Status: AC
Start: 2023-05-31 — End: 2023-06-10

## 2023-05-31 NOTE — ED Triage Notes (Signed)
Pt c/o itchy rash between fingers and ankles x1week. States using creams with no relief.   Pt c/o sinus infection with congestion for past few weeks.

## 2023-05-31 NOTE — Discharge Instructions (Signed)
You were diagnosed with a sinus infection today.  I have sent out an antibiotic to take twice/day x 10 days.  I have also sent out an ointment for your dry skin/eczema.  Avoid over washing your hands and avoid harsh chemicals.  Return if not improving or worsening.

## 2023-05-31 NOTE — ED Provider Notes (Signed)
[facility]    CSN: [id number] Arrival date & time: 05/31/23  1054      History   Chief Complaint Chief Complaint  Patient presents with   Rash    HPI Harry Donovan is a 28 y.o. male.    Rash  Patient is here for a rash between his fingers and on his ankles x 1 week.  It is itchy.  He was using otc hand cream for fungus without any help.   Also here for sinus congestion for a long time, but worse the last several weeks.  He has been using otc medications, motrin with some help, but does not resolve.   He does have sinus pain pressure.        Past Medical History:  Diagnosis Date   Acid reflux    Hypertension     Patient Active Problem List   Diagnosis Date Noted   Hyperglycemia 01/10/2011   Chest pain 12/06/2010   Essential hypertension, benign 12/06/2010   Encounter for sports participation examination 12/06/2010    History reviewed. No pertinent surgical history.     Home Medications    Prior to Admission medications   Medication Sig Start Date End Date Taking? Authorizing Provider  albuterol (VENTOLIN HFA) 108 (90 Base) MCG/ACT inhaler Inhale 1-2 puffs into the lungs every 6 (six) hours as needed for wheezing or shortness of breath. 12/07/21   Barbette Merino, NP  ibuprofen (ADVIL) 800 MG tablet Take 1 tablet (800 mg total) by mouth 3 (three) times daily. 04/02/21   Lamptey, Britta Mccreedy, MD    Family History Family History  Problem Relation Age of Onset   Hypertension Mother    Hypertension Father    Diabetes Father     Social History Social History   Tobacco Use   Smoking status: Some Days    Types: Cigars   Smokeless tobacco: Never  Vaping Use   Vaping status: Never Used  Substance Use Topics   Alcohol use: Not Currently   Drug use: No     Allergies   Patient has no known allergies.   Review of Systems Review of Systems  Constitutional: Negative.   HENT:  Positive for congestion and rhinorrhea.   Respiratory:  Negative.    Cardiovascular: Negative.   Gastrointestinal: Negative.   Musculoskeletal: Negative.   Skin:  Positive for rash.     Physical Exam Triage Vital Signs ED Triage Vitals  Encounter Vitals Group     BP 05/31/23 1117 (!) 161/73     Systolic BP Percentile --      Diastolic BP Percentile --      Pulse Rate 05/31/23 1117 71     Resp 05/31/23 1117 18     Temp 05/31/23 1117 97.7 F (36.5 C)     Temp Source 05/31/23 1117 Oral     SpO2 05/31/23 1117 97 %     Weight --      Height --      Head Circumference --      Peak Flow --      Pain Score 05/31/23 1118 0     Pain Loc --      Pain Education --      Exclude from Growth Chart --    No data found.  Updated Vital Signs BP (!) 161/73 (BP Location: Left Arm)   Pulse 71   Temp 97.7 F (36.5 C) (Oral)   Resp 18   SpO2 97%  Visual Acuity Right Eye Distance:   Left Eye Distance:   Bilateral Distance:    Right Eye Near:   Left Eye Near:    Bilateral Near:     Physical Exam Constitutional:      General: He is not in acute distress.    Appearance: Normal appearance. He is normal weight. He is ill-appearing. He is not toxic-appearing.  HENT:     Nose: Congestion present.     Right Sinus: Maxillary sinus tenderness present.     Left Sinus: Maxillary sinus tenderness present.  Cardiovascular:     Rate and Rhythm: Normal rate and regular rhythm.  Pulmonary:     Effort: Pulmonary effort is normal.     Breath sounds: Normal breath sounds.  Skin:    Comments: Dry, scaly skin to the hands bilaterally, esp in between the fingers.  The right hand is worse, skin is slightly cracked;  this also extends to the wrist at the lateral aspect of the hand;  There is another dry patch of skin to the left medial lower leg/ankle  Neurological:     General: No focal deficit present.     Mental Status: He is alert.  Psychiatric:        Mood and Affect: Mood normal.      UC Treatments / Results  Labs (all labs ordered are  listed, but only abnormal results are displayed) Labs Reviewed - No data to display  EKG   Radiology No results found.  Procedures Procedures (including critical care time)  Medications Ordered in UC Medications - No data to display  Initial Impression / Assessment and Plan / UC Course  I have reviewed the triage vital signs and the nursing notes.  Pertinent labs & imaging results that were available during my care of the patient were reviewed by me and considered in my medical decision making (see chart for details).   Final Clinical Impressions(s) / UC Diagnoses   Final diagnoses:  Acute non-recurrent maxillary sinusitis  Eczema, unspecified type     Discharge Instructions      You were diagnosed with a sinus infection today.  I have sent out an antibiotic to take twice/day x 10 days.  I have also sent out an ointment for your dry skin/eczema.  Avoid over washing your hands and avoid harsh chemicals.  Return if not improving or worsening.      ED Prescriptions     Medication Sig Dispense Auth. Provider   amoxicillin-clavulanate (AUGMENTIN) 875-125 MG tablet Take 1 tablet by mouth every 12 (twelve) hours for 10 days. 20 tablet Brandey Vandalen, MD   triamcinolone ointment (KENALOG) 0.5 % Apply 1 Application topically 2 (two) times daily. 30 g Jannifer Franklin, MD      PDMP not reviewed this encounter.   Jannifer Franklin, MD 05/31/23 [phone]

## 2023-07-08 ENCOUNTER — Ambulatory Visit (HOSPITAL_COMMUNITY)
Admission: EM | Admit: 2023-07-08 | Discharge: 2023-07-08 | Disposition: A | Discharge status: Home / Self Care | Payer: Self-pay | Attending: Family Medicine | Admitting: Family Medicine

## 2023-07-08 ENCOUNTER — Encounter (HOSPITAL_COMMUNITY): Payer: Self-pay | Admitting: *Deleted

## 2023-07-08 ENCOUNTER — Other Ambulatory Visit: Payer: Self-pay

## 2023-07-08 DIAGNOSIS — S39012A Strain of muscle, fascia and tendon of lower back, initial encounter: Secondary | ICD-10-CM

## 2023-07-08 MED ORDER — KETOROLAC TROMETHAMINE 30 MG/ML IJ SOLN
INTRAMUSCULAR | Status: AC
  Filled 2023-07-08: qty 1

## 2023-07-08 MED ORDER — KETOROLAC TROMETHAMINE 30 MG/ML IJ SOLN
30.0000 mg | Freq: Once | INTRAMUSCULAR | Status: AC
  Administered 2023-07-08: 30 mg via INTRAMUSCULAR

## 2023-07-08 MED ORDER — MELOXICAM 15 MG PO TABS: 15.0000 mg | ORAL_TABLET | Freq: Every day | ORAL | 0 refills | Status: AC

## 2023-07-08 MED ORDER — CYCLOBENZAPRINE HCL 10 MG PO TABS
10.0000 mg | ORAL_TABLET | Freq: Two times a day (BID) | ORAL | 0 refills | Status: AC | PRN
Start: 2023-07-08 — End: ?

## 2023-07-08 NOTE — ED Triage Notes (Signed)
 PT reports his back pain started on Sat . Pt reports he was playing Basketball [employer] and felt a shift in his back and now he can not walk due to back pain. 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

## 2023-07-08 NOTE — ED Provider Notes (Signed)
 [facility]    CSN: [id number] Arrival date & time: 07/08/23  5784      History   Chief Complaint Chief Complaint  Patient presents with   Back Pain    HPI Harry Donovan is a 28 y.o. male.   Patient is presenting with right-sided lower back pain.  Patient states that on Saturday explained past follow-up for rebound and felt a pop in his back.  Patient states that afterwards he had some right-sided pain which is worsened over the past few days.  Patient is that he put ice on it last night but notes that that made it worse.  Patient's pain is all paraspinal in nature and does not radiate down the leg.  Patient states that he feels a sharp pain in his lower back whenever he tries to twist and turn.  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   Back Pain   Past Medical History:  Diagnosis Date   Acid reflux    Hypertension     Patient Active Problem List   Diagnosis Date Noted   Hyperglycemia 01/10/2011   Chest pain 12/06/2010   Essential hypertension, benign 12/06/2010   Encounter for sports participation examination 12/06/2010    History reviewed. No pertinent surgical history.     Home Medications    Prior to Admission medications   Medication Sig Start Date End Date Taking? Authorizing Provider  cyclobenzaprine (FLEXERIL) 10 MG tablet Take 1 tablet (10 mg total) by mouth 2 (two) times daily as needed for muscle spasms. 07/08/23  Yes Brenton Grills, MD  meloxicam (MOBIC) 15 MG tablet Take 1 tablet (15 mg total) by mouth daily. 07/08/23  Yes Brenton Grills, MD  albuterol (VENTOLIN HFA) 108 (90 Base) MCG/ACT inhaler Inhale 1-2 puffs into the lungs every 6 (six) hours as needed for wheezing or shortness of breath. 12/07/21   Barbette Merino, NP  triamcinolone ointment (KENALOG) 0.5 % Apply 1 Application topically 2 (two) times daily. 05/31/23   Jannifer Franklin, MD    Family History Family  History  Problem Relation Age of Onset   Hypertension Mother    Hypertension Father    Diabetes Father     Social History Social History   Tobacco Use   Smoking status: Some Days    Types: Cigars   Smokeless tobacco: Never  Vaping Use   Vaping status: Never Used  Substance Use Topics   Alcohol use: Not Currently   Drug use: No     Allergies   Patient has no known allergies.   Review of Systems Review of Systems  Musculoskeletal:  Positive for back pain.     Physical Exam Triage Vital Signs ED Triage Vitals  Encounter Vitals Group     BP 07/08/23 0926 124/82     Systolic BP Percentile --      Diastolic BP Percentile --      Pulse Rate 07/08/23 0926 65     Resp 07/08/23 0926 20     Temp 07/08/23 0926 98.8 F (37.1 C)     Temp src --      SpO2 07/08/23 0926 96 %     Weight --      Height --      Head Circumference --      Peak Flow --      Pain Score 07/08/23 0925 8     Pain  Loc --      Pain Education --      Exclude from Growth Chart --    No data found.  Updated Vital Signs BP 124/82   Pulse 65   Temp 98.8 F (37.1 C)   Resp 20   SpO2 96%   Visual Acuity Right Eye Distance:   Left Eye Distance:   Bilateral Distance:    Right Eye Near:   Left Eye Near:    Bilateral Near:     Physical Exam Inspection reveals no gross abnormality of the lumbar spine.  There is tenderness to palpation over the lumbar paraspinal muscles around L2-L5.  Patient has decreased range of motion with extension, flexion as well as twisting and turning and sidebending.  Strength is 5 out of 5 in the lower extremities.  Straight leg test is negative.  No neurovascular changes.  UC Treatments / Results  Labs (all labs ordered are listed, but only abnormal results are displayed) Labs Reviewed - No data to display  EKG   Radiology No results found.  Procedures Procedures (including critical care time)  Medications Ordered in UC Medications  ketorolac (TORADOL)  30 MG/ML injection 30 mg (30 mg Intramuscular Given 07/08/23 0946)    Initial Impression / Assessment and Plan / UC Course  I have reviewed the triage vital signs and the nursing notes.  Pertinent labs & imaging results that were available during my care of the patient were reviewed by me and considered in my medical decision making (see chart for details).     Patient likely dealing with a lumbar strain.  At this time we will go ahead and give patient Toradol injection today for pain as well as Flexeril and meloxicam.  Advised patient to continue using heat over the affected area and not ice.  Patient standing and agreeable with plan.  Patient advised to follow-up if symptoms worsen or do not improve. Final Clinical Impressions(s) / UC Diagnoses   Final diagnoses:  Strain of lumbar region, initial encounter     Discharge Instructions      Please take your meloxicam daily for the next 10 days,  You may take your Flexeril for the spasms as well, please note this may make you little bit drowsy so you can take it at night first     ED Prescriptions     Medication Sig Dispense Auth. Provider   cyclobenzaprine (FLEXERIL) 10 MG tablet Take 1 tablet (10 mg total) by mouth 2 (two) times daily as needed for muscle spasms. 20 tablet Brenton Grills, MD   meloxicam (MOBIC) 15 MG tablet Take 1 tablet (15 mg total) by mouth daily. 14 tablet Brenton Grills, MD      PDMP not reviewed this encounter.   Brenton Grills, MD 07/08/23 1000

## 2023-07-08 NOTE — Discharge Instructions (Signed)
 Please take your meloxicam daily for the next 10 days,  You may take your Flexeril for the spasms as well, please note this may make you little bit drowsy so you can take it at night first

## 2023-10-01 ENCOUNTER — Encounter (HOSPITAL_COMMUNITY): Payer: Self-pay

## 2023-10-01 ENCOUNTER — Ambulatory Visit (HOSPITAL_COMMUNITY)
Admission: EM | Admit: 2023-10-01 | Discharge: 2023-10-01 | Disposition: A | Discharge status: Home / Self Care | Payer: Self-pay | Attending: Family Medicine | Admitting: Family Medicine

## 2023-10-01 DIAGNOSIS — G8929 Other chronic pain: Secondary | ICD-10-CM

## 2023-10-01 DIAGNOSIS — M545 Low back pain, unspecified: Secondary | ICD-10-CM

## 2023-10-01 DIAGNOSIS — R0981 Nasal congestion: Secondary | ICD-10-CM

## 2023-10-01 MED ORDER — PREDNISONE 10 MG (48) PO TBPK: ORAL_TABLET | ORAL | 0 refills | Status: AC

## 2023-10-01 NOTE — ED Triage Notes (Signed)
 Pt c/o lt lower back pain radiating down leg for over 5ys after a injury. Taking OTC with no relief.   Pt c/o nasal congestion and having problems breathing at night.

## 2023-10-01 NOTE — ED Provider Notes (Signed)
 [facility]   [id number] 10/01/23 Arrival Time: 0810  ASSESSMENT & PLAN:  1. Chronic bilateral low back pain without sciatica   2. Nasal congestion    Able to ambulate here and hemodynamically stable. No indication for imaging of back at this time given no trauma and normal neurological exam.  Trial of: Meds ordered this encounter  Medications   predniSONE  (STERAPRED UNI-PAK 48 TAB) 10 MG (48) TBPK tablet    Sig: Take as directed.    Dispense:  48 tablet    Refill:  0   Work/school excuse note: not needed. Encourage ROM/movement as tolerated.  May need to see ENT for chronic nasal congestion and trouble breathing through his nose. Plans to wait until he has insurance.  Recommend:  Follow-up Information     Schedule an appointment as soon as possible for a visit  with Shauna Del, DO.   Specialty: Sports Medicine Contact information: [address] [place]  [place] [address] [phone]                 Reviewed expectations re: course of current medical issues. Questions answered. Outlined signs and symptoms indicating need for more acute intervention. Patient verbalized understanding. After Visit Summary given.   SUBJECTIVE: History from: patient.  Harry Donovan is a 28 y.o. male who presents with complaint of bilateral LBP; reports injury/strain approx 5 years ago; pain since. Occas radiation 'upward' but none recently. No extremity sensation changes or weakness. Normal bowel/bladder habits. Pain has affected his ability to hold a job; desires to work. Pain does affect sleep. OTC without relief. Also with chronic nasal congestion. Thinks he has been told he has sleep apnea in distant past. Denies current SOB/CP.    OBJECTIVE:  Vitals:   10/01/23 0910  BP: (!) 133/90  Pulse: 76  Resp: 18  Temp: 98.3 F (36.8 C)  TempSrc: Oral  SpO2: 96%    General appearance: alert; no distress HEENT: [place]; AT; turbinates boggy bilaterally Neck: supple  with FROM; without midline tenderness Lungs: unlabored respirations; speaks full sentences without difficulty Abdomen: obese; soft, non-tender; non-distended Back: no specific tenderness to palpation; FROM at waist; bruising: none; without midline tenderness Extremities: without edema; symmetrical without gross deformities; normal ROM of bilateral LE Skin: warm and dry Neurologic: normal gait; normal sensation and strength of bilateral LE Psychological: alert and cooperative; normal mood and affect   No Known Allergies  Past Medical History:  Diagnosis Date   Acid reflux    Hypertension    Social History   Socioeconomic History   Marital status: Single    Spouse name: Not on file   Number of children: Not on file   Years of education: Not on file   Highest education level: Not on file  Occupational History   Not on file  Tobacco Use   Smoking status: Some Days    Types: Cigars   Smokeless tobacco: Never  Vaping Use   Vaping status: Never Used  Substance and Sexual Activity   Alcohol use: Not Currently   Drug use: No   Sexual activity: Not on file  Other Topics Concern   Not on file  Social History Narrative   Football player at [employer]   Social Drivers of Health   Financial Resource Strain: Not on file  Food Insecurity: Not on file  Transportation Needs: Not on file  Physical Activity: Not on file  Stress: Not on file  Social Connections: Not on file  Intimate  Partner Violence: Not on file   Family History  Problem Relation Age of Onset   Hypertension Mother    Hypertension Father    Diabetes Father    History reviewed. No pertinent surgical history.    Afton Albright, MD 10/01/23 1120

## 2023-10-15 ENCOUNTER — Ambulatory Visit: Payer: Self-pay | Admitting: Sports Medicine

## 2023-12-19 IMAGING — CR DG CHEST 2V
2 series · 2 of 2 positions shown · non-contrast
Comparison: Chest x-ray 10/28/2020.

CLINICAL DATA: 26-year-old male with history of dyspnea and chest
pain.

EXAM:
CHEST - 2 VIEW

[chest pa]
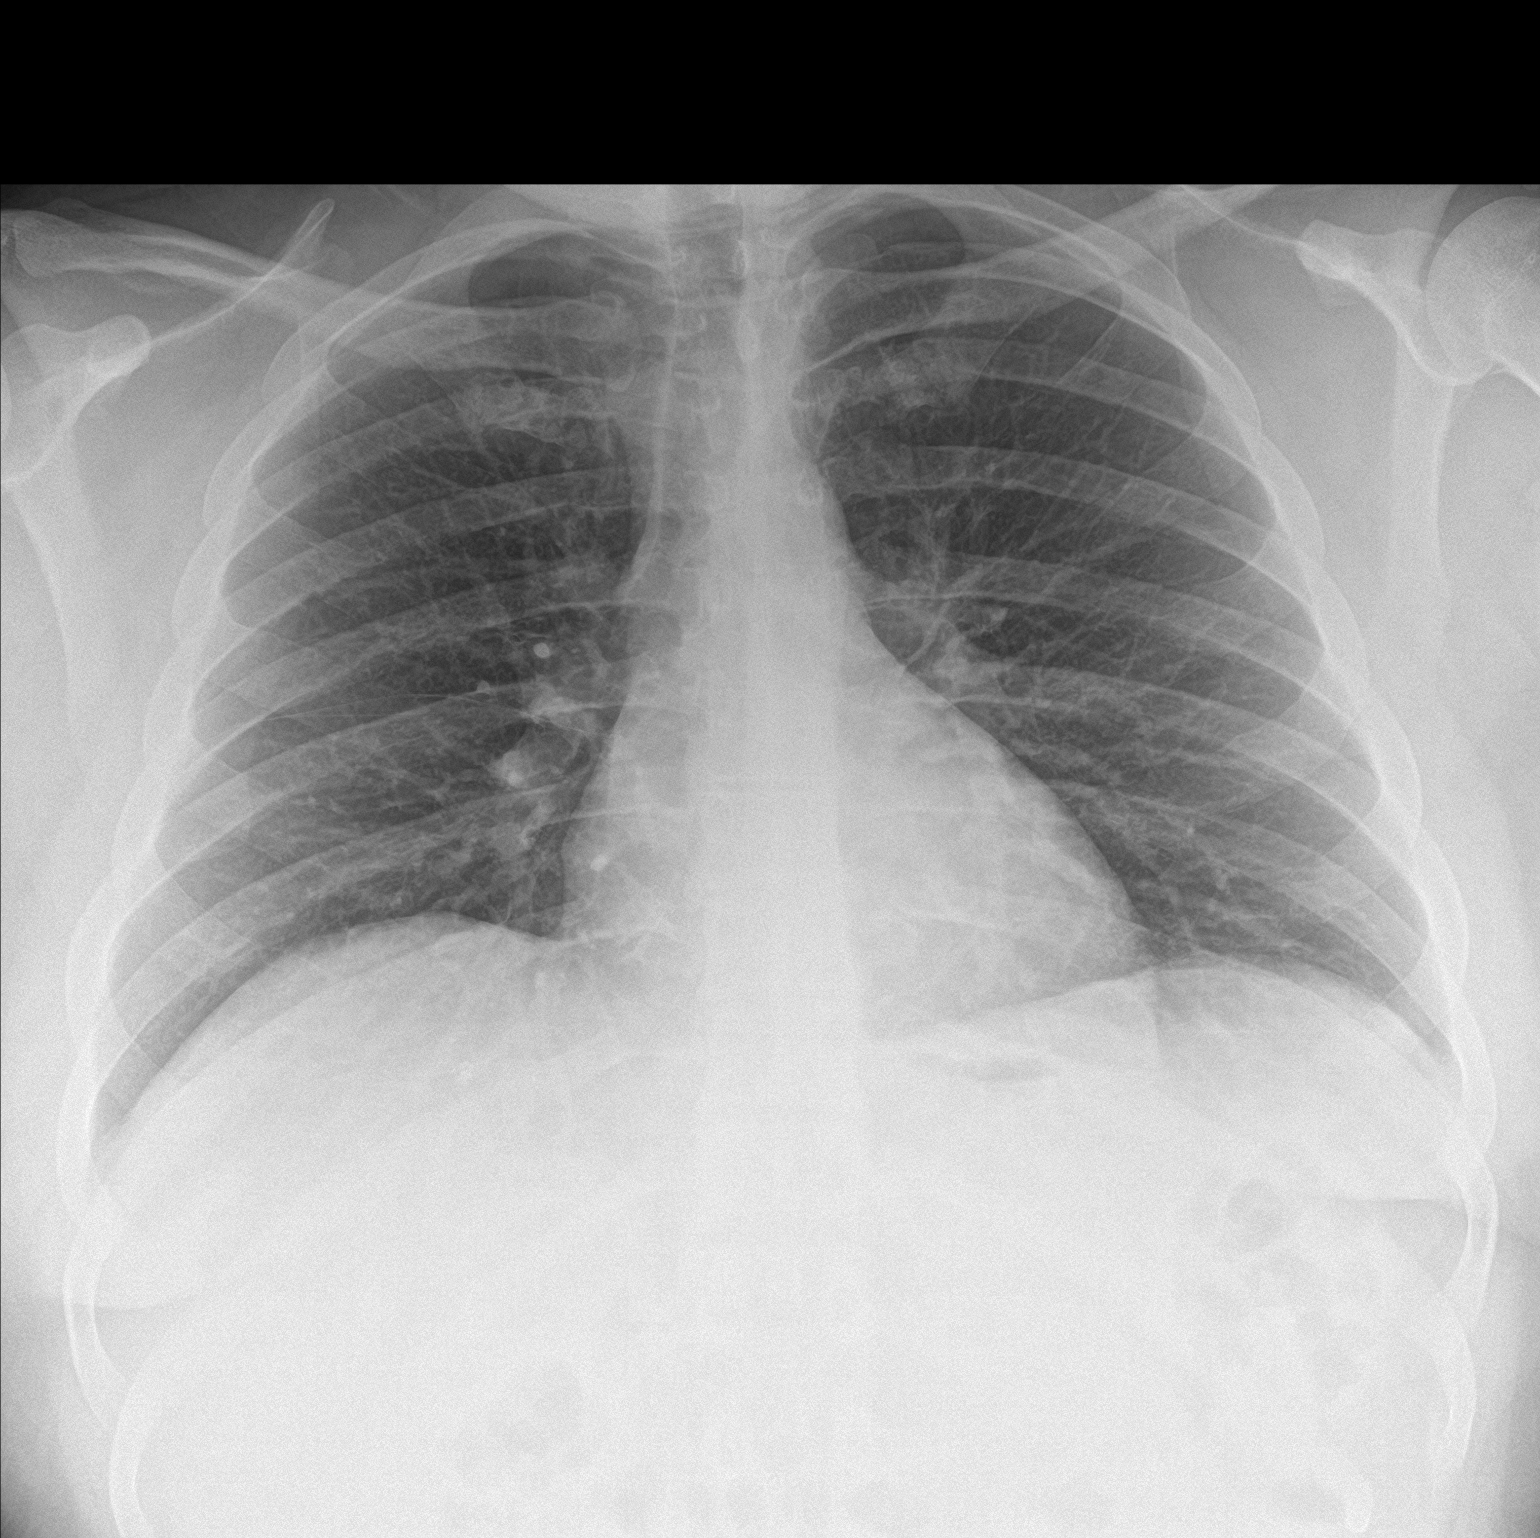

[chest lat]
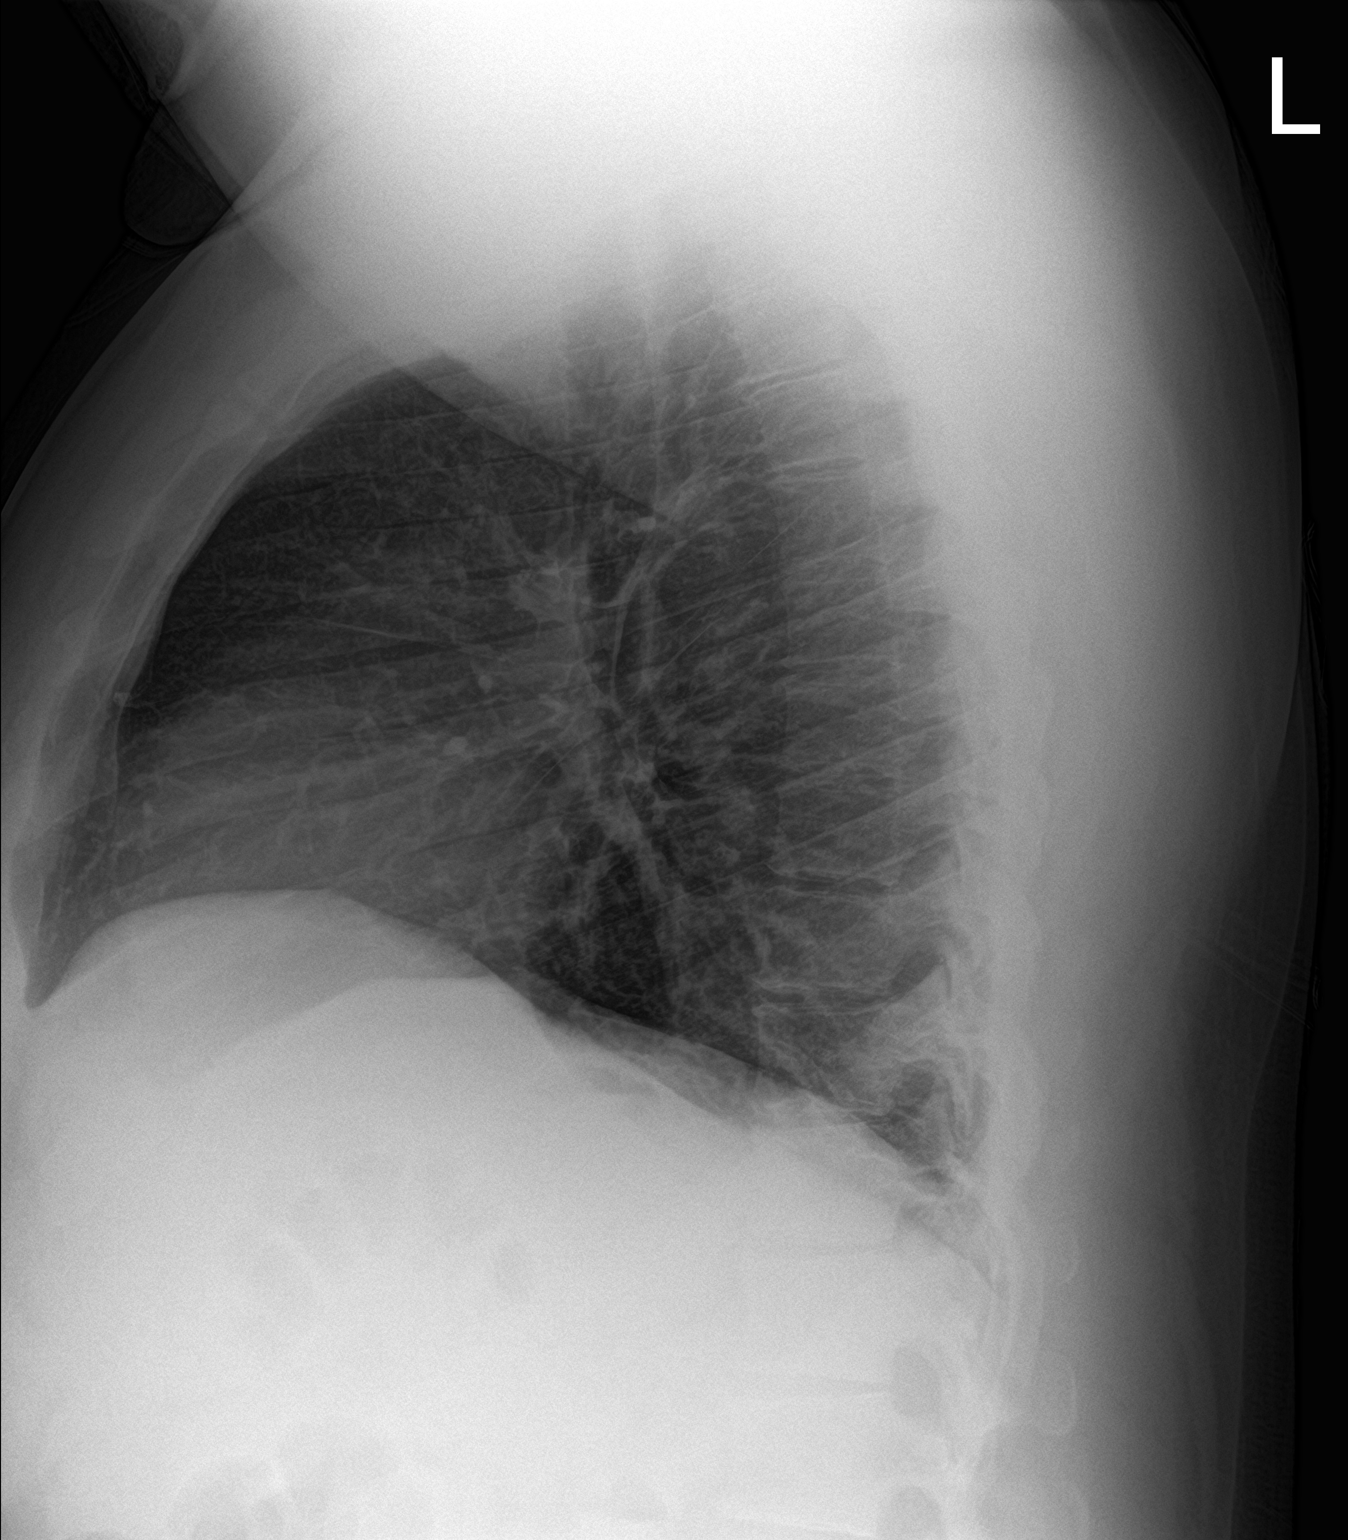

[2 of 2 positions shown; findings below may reference images not displayed]

FINDINGS: Lung volumes are normal. No consolidative airspace disease. No
pleural effusions. No pneumothorax. No pulmonary nodule or mass
noted. Pulmonary vasculature and the cardiomediastinal silhouette
are within normal limits.
IMPRESSION: No radiographic evidence of acute cardiopulmonary disease.

## 2024-02-26 ENCOUNTER — Emergency Department (HOSPITAL_COMMUNITY): Payer: Self-pay

## 2024-02-26 ENCOUNTER — Encounter (HOSPITAL_COMMUNITY): Payer: Self-pay

## 2024-02-26 ENCOUNTER — Emergency Department (HOSPITAL_COMMUNITY)
Admission: EM | Admit: 2024-02-26 | Discharge: 2024-02-26 | Disposition: A | Discharge status: Home / Self Care | Payer: Self-pay | Attending: Emergency Medicine | Admitting: Emergency Medicine

## 2024-02-26 ENCOUNTER — Other Ambulatory Visit: Payer: Self-pay

## 2024-02-26 DIAGNOSIS — J0101 Acute recurrent maxillary sinusitis: Secondary | ICD-10-CM | POA: Insufficient documentation

## 2024-02-26 DIAGNOSIS — R42 Dizziness and giddiness: Secondary | ICD-10-CM | POA: Insufficient documentation

## 2024-02-26 LAB — COMPREHENSIVE METABOLIC PANEL WITH GFR
ALT: 29 U/L (ref 0–44)
AST: 19 U/L (ref 15–41)
Albumin: 3 g/dL — ABNORMAL LOW (ref 3.5–5.0)
Alkaline Phosphatase: 40 U/L (ref 38–126)
Anion gap: 7 (ref 5–15)
BUN: 18 mg/dL (ref 6–20)
CO2: 22 mmol/L (ref 22–32)
Calcium: 8.1 mg/dL — ABNORMAL LOW (ref 8.9–10.3)
Chloride: 110 mmol/L (ref 98–111)
Creatinine, Ser: 1.15 mg/dL (ref 0.61–1.24)
GFR, Estimated: >60 mL/min (ref >60)
Glucose, Bld: 94 mg/dL (ref 70–99)
Potassium: 3.9 mmol/L (ref 3.5–5.1)
Sodium: 139 mmol/L (ref 135–145)
Total Bilirubin: 0.6 mg/dL (ref 0.0–1.2)
Total Protein: 5.3 g/dL — ABNORMAL LOW (ref 6.5–8.1)

## 2024-02-26 LAB — CBC
HCT: 44.6 % (ref 39.0–52.0)
Hemoglobin: 13.2 g/dL (ref 13.0–17.0)
MCH: 27.1 pg (ref 26.0–34.0)
MCHC: 29.6 g/dL — ABNORMAL LOW (ref 30.0–36.0)
MCV: 91.6 fL (ref 80.0–100.0)
Platelets: 187 K/uL (ref 150–400)
RBC: 4.87 MIL/uL (ref 4.22–5.81)
RDW: 14.2 % (ref 11.5–15.5)
WBC: 10.1 K/uL (ref 4.0–10.5)
nRBC: 0 % (ref 0.0–0.2)

## 2024-02-26 LAB — URINALYSIS, ROUTINE W REFLEX MICROSCOPIC
Bilirubin Urine: NEGATIVE
Glucose, UA: NEGATIVE mg/dL
Hgb urine dipstick: NEGATIVE
Ketones, ur: NEGATIVE mg/dL
Leukocytes,Ua: NEGATIVE
Nitrite: NEGATIVE
Protein, ur: NEGATIVE mg/dL
Specific Gravity, Urine: 1.01 (ref 1.005–1.030)
pH: 6 (ref 5.0–8.0)

## 2024-02-26 LAB — RESP PANEL BY RT-PCR (RSV, FLU A&B, COVID)  RVPGX2
Influenza A by PCR: NEGATIVE
Influenza B by PCR: NEGATIVE
Resp Syncytial Virus by PCR: NEGATIVE
SARS Coronavirus 2 by RT PCR: NEGATIVE

## 2024-02-26 LAB — LIPASE, BLOOD: Lipase: 36 U/L (ref 11–51)

## 2024-02-26 MED ORDER — AMOXICILLIN-POT CLAVULANATE 875-125 MG PO TABS: 1.0000 | ORAL_TABLET | Freq: Two times a day (BID) | ORAL | 0 refills | Status: AC

## 2024-02-26 NOTE — Discharge Instructions (Addendum)
 You are seen in the emergency department today for concerns of congestion and vomiting.  I believe this is likely due to a sinus infection currently causing the feeling of pressure in her head as well as lightheadedness.  I started on a course of antibiotics he will take twice daily for the next 7 days.  Follow-up with your primary care provider.  You will also likely benefit from an ENT evaluation and I would reach out to the office listed on this paperwork for an appointment.

## 2024-02-26 NOTE — ED Provider Notes (Signed)
 [place] EMERGENCY DEPARTMENT AT [facility] Provider Note   CSN: [id number] Arrival date & time: 02/26/24  9592     Patient presents with: Vomiting and Nasal Congestion   Harry Donovan is a 28 y.o. male patient with past history significant for GERD presents to the emergency department with concerns of congestion and vomiting.  States he woke up this morning feeling off with a feeling of lightheadedness, dizziness, and left arm tingling.  States the tingling subsided quickly.  Denies any fever but endorses ongoing congestion and discolored phlegm.  No reported sick contacts.  Denies any diarrhea.  No reported abdominal pain.  He states that he has had an ongoing issue with sinus problems for several years but has not been seen by any specialist.  HPI     Prior to Admission medications   Medication Sig Start Date End Date Taking? Authorizing Provider  amoxicillin -clavulanate (AUGMENTIN ) 875-125 MG tablet Take 1 tablet by mouth every 12 (twelve) hours for 7 days. 02/26/24 03/04/24 Yes Draylon Mercadel A, PA-C  albuterol  (VENTOLIN  HFA) 108 (90 Base) MCG/ACT inhaler Inhale 1-2 puffs into the lungs every 6 (six) hours as needed for wheezing or shortness of breath. 12/07/21   Myrna Camelia CHRISTELLA, NP  cyclobenzaprine  (FLEXERIL ) 10 MG tablet Take 1 tablet (10 mg total) by mouth 2 (two) times daily as needed for muscle spasms. 07/08/23   Jha, Panav, MD  meloxicam  (MOBIC ) 15 MG tablet Take 1 tablet (15 mg total) by mouth daily. 07/08/23   Jha, Panav, MD  predniSONE  (STERAPRED UNI-PAK 48 TAB) 10 MG (48) TBPK tablet Take as directed. 10/01/23   Rolinda Rogue, MD  triamcinolone  ointment (KENALOG ) 0.5 % Apply 1 Application topically 2 (two) times daily. 05/31/23   Darral Longs, MD    Allergies: Patient has no known allergies.    Review of Systems  All other systems reviewed and are negative.   Updated Vital Signs BP 115/70   Pulse (!) 50   Temp 98 F (36.7 C) (Oral)   Resp 18   SpO2 100%    Physical Exam Vitals and nursing note reviewed.  Constitutional:      General: He is not in acute distress.    Appearance: He is well-developed.  HENT:     Head: Normocephalic and atraumatic.     Comments: Tenderness to palpation over the left maxillary sinus.    Nose: Congestion present. No rhinorrhea.  Eyes:     Conjunctiva/sclera: Conjunctivae normal.  Cardiovascular:     Rate and Rhythm: Normal rate and regular rhythm.     Heart sounds: No murmur heard. Pulmonary:     Effort: Pulmonary effort is normal. No respiratory distress.     Breath sounds: Normal breath sounds.  Abdominal:     Palpations: Abdomen is soft.     Tenderness: There is no abdominal tenderness.  Musculoskeletal:        General: No swelling.     Cervical back: Neck supple.  Skin:    General: Skin is warm and dry.     Capillary Refill: Capillary refill takes less than 2 seconds.  Neurological:     Mental Status: He is alert.  Psychiatric:        Mood and Affect: Mood normal.     (all labs ordered are listed, but only abnormal results are displayed) Labs Reviewed  COMPREHENSIVE METABOLIC PANEL WITH GFR - Abnormal; Notable for the following components:      Result Value   Calcium  8.1 (*)    Total Protein 5.3 (*)    Albumin 3.0 (*)    All other components within normal limits  CBC - Abnormal; Notable for the following components:   MCHC 29.6 (*)    All other components within normal limits  RESP PANEL BY RT-PCR (RSV, FLU A&B, COVID)  [id number]  LIPASE, BLOOD  URINALYSIS, ROUTINE W REFLEX MICROSCOPIC    EKG: None  Radiology: CT Head Wo Contrast Result Date: 02/26/2024 EXAM: CT HEAD WITHOUT CONTRAST 02/26/2024 07:09:00 AM TECHNIQUE: CT of the head was performed without the administration of intravenous contrast. Automated exposure control, iterative reconstruction, and/or weight based adjustment of the mA/kV was utilized to reduce the radiation dose to as low as reasonably achievable. COMPARISON:  None available. CLINICAL HISTORY: Headache, increasing frequency or severity. Says he woke up this morning feeling weird. Unable to verbalize how he felt weird. Says he was not able to breath normal, nasal congestion and had a very brief left arm tingling that lasted less than a minute. Once he got to the hospital he had 1 episode of vomiting. FINDINGS: BRAIN AND VENTRICLES: No acute hemorrhage. No evidence of acute infarct. No hydrocephalus. No extra-axial collection. No mass effect or midline shift. ORBITS: No acute abnormality. SINUSES: No acute abnormality. SOFT TISSUES AND SKULL: No acute soft tissue abnormality. No skull fracture. IMPRESSION: 1. No acute intracranial abnormality. Electronically signed by: Waddell Calk MD 02/26/2024 07:17 AM EDT RP Workstation: [id number]     Procedures   Medications Ordered in the ED - No data to display                                  Medical Decision Making Amount and/or Complexity of Data Reviewed Radiology: ordered.   This patient presents to the ED for concern of nasal congestion, vomiting.  Differential diagnosis includes GERD, viral URI, sinusitis, gastroenteritis   Lab Tests:  I Ordered, and personally interpreted labs.  The pertinent results include: CBC and CMP unremarkable with exception of mild hypocalcemia and hypoproteinemia likely secondary to diet, lipase negative at 36, UA negative for signs infection and no proteinuria seen, respiratory panel negative   Imaging Studies ordered:  I ordered imaging studies including CT head I independently visualized and interpreted imaging which showed no acute findings I agree with the radiologist interpretation   Problem List / ED Course:  Patient without significant medical history beyond GERD presents to the emergency department with concerns of vomiting and nasal congestion.  States he woke up this morning around 3 AM with feelings of lightheadedness, sinus pressure, and some slight  nausea.  He reports vomiting on his way here to the hospital.  Denies any vomiting yesterday, or any recent diarrhea.  No reported sick contacts.  Denies fevers. On exam, patient has no focal abnormal findings.  Normal heart or lung sounds.  There is no wheezing, rales, rhonchi.  He does have left maxillary sinus tenderness on exam.  When patient blows his nose, mucus has a slight green discoloration. Based on patient's symptoms, suspect likely sinusitis.  However, with his lightheadedness reported and ongoing symptoms off and on for several months, will obtain imaging of his head to rule out other potential causes of symptoms.  Will reassess shortly. On reevaluation, patient has no acute or focal concerns.  Remained stable.  CT head negative.  Suspect likely sinusitis, early causing his symptoms.  Will start a course  of Augmentin  twice daily for 7 days.  Return precautions discussed such as concerns for new or worsening symptoms.  Discharged home in stable condition.   Social Determinants of Health:  None  Final diagnoses:  Acute recurrent maxillary sinusitis    ED Discharge Orders          Ordered    amoxicillin -clavulanate (AUGMENTIN ) 875-125 MG tablet  Every 12 hours        02/26/24 0756               Garion Wempe A, PA-C 02/26/24 0758    Horton, Kristie M, DO 02/26/24 1115

## 2024-02-26 NOTE — ED Triage Notes (Signed)
 Says he woke up this morning feeling weird. Unable to verbalize how he felt weird.   Says he was not able to breath normal, nasal congestion and had a very brief left arm tingling that lasted less than a minute.   Once he got to the hospital he had 1 episode of vomiting.

## 2024-03-09 ENCOUNTER — Encounter: Payer: Self-pay | Admitting: Radiology
